# Patient Record
Sex: Female | Born: 1940 | Race: White | Hispanic: No | Smoking: Never smoker
Health system: Southern US, Community
[De-identification: ages and names within clinical notes are randomized; demographics above are authoritative.]

## PROBLEM LIST (undated history)

## (undated) DIAGNOSIS — E785 Hyperlipidemia, unspecified: Secondary | ICD-10-CM

## (undated) DIAGNOSIS — R945 Abnormal results of liver function studies: Secondary | ICD-10-CM

## (undated) DIAGNOSIS — I10 Essential (primary) hypertension: Secondary | ICD-10-CM

## (undated) DIAGNOSIS — I4891 Unspecified atrial fibrillation: Secondary | ICD-10-CM

## (undated) DIAGNOSIS — J189 Pneumonia, unspecified organism: Secondary | ICD-10-CM

## (undated) DIAGNOSIS — D580 Hereditary spherocytosis: Secondary | ICD-10-CM

## (undated) DIAGNOSIS — G9340 Encephalopathy, unspecified: Secondary | ICD-10-CM

## (undated) HISTORY — DX: Encephalopathy, unspecified: G93.40

## (undated) HISTORY — DX: Unspecified atrial fibrillation: I48.91

## (undated) HISTORY — DX: Abnormal results of liver function studies: R94.5

## (undated) HISTORY — DX: Hyperlipidemia, unspecified: E78.5

## (undated) HISTORY — PX: SPLENECTOMY: SUR1306

## (undated) HISTORY — DX: Pneumonia, unspecified organism: J18.9

---

## 1999-09-20 ENCOUNTER — Emergency Department (HOSPITAL_COMMUNITY): Admission: EM | Admit: 1999-09-20 | Discharge: 1999-09-20 | Payer: Self-pay | Admitting: Emergency Medicine

## 1999-09-20 ENCOUNTER — Encounter: Payer: Self-pay | Admitting: Emergency Medicine

## 1999-10-21 ENCOUNTER — Encounter: Payer: Self-pay | Admitting: Endocrinology

## 1999-10-21 ENCOUNTER — Encounter: Admission: RE | Admit: 1999-10-21 | Discharge: 1999-10-21 | Payer: Self-pay | Admitting: Endocrinology

## 1999-11-16 ENCOUNTER — Other Ambulatory Visit: Admission: RE | Admit: 1999-11-16 | Discharge: 1999-11-16 | Payer: Self-pay | Admitting: Endocrinology

## 2000-12-07 ENCOUNTER — Other Ambulatory Visit: Admission: RE | Admit: 2000-12-07 | Discharge: 2000-12-07 | Payer: Self-pay | Admitting: Endocrinology

## 2000-12-21 ENCOUNTER — Encounter: Payer: Self-pay | Admitting: Endocrinology

## 2000-12-21 ENCOUNTER — Encounter: Admission: RE | Admit: 2000-12-21 | Discharge: 2000-12-21 | Payer: Self-pay | Admitting: Endocrinology

## 2002-11-04 ENCOUNTER — Ambulatory Visit (HOSPITAL_COMMUNITY): Admission: RE | Admit: 2002-11-04 | Discharge: 2002-11-04 | Payer: Self-pay | Admitting: Ophthalmology

## 2002-11-06 ENCOUNTER — Ambulatory Visit (HOSPITAL_COMMUNITY): Admission: RE | Admit: 2002-11-06 | Discharge: 2002-11-06 | Payer: Self-pay | Admitting: Ophthalmology

## 2007-12-31 ENCOUNTER — Emergency Department (HOSPITAL_COMMUNITY): Admission: EM | Admit: 2007-12-31 | Discharge: 2007-12-31 | Payer: Self-pay | Admitting: Emergency Medicine

## 2011-02-25 LAB — POCT I-STAT, CHEM 8
BUN: 7
Calcium, Ion: 1.15
Chloride: 100
Creatinine, Ser: 1
Glucose, Bld: 100 — ABNORMAL HIGH
HCT: 43
Hemoglobin: 14.6
Potassium: 3.5
Sodium: 139
TCO2: 31

## 2012-09-07 ENCOUNTER — Telehealth (HOSPITAL_COMMUNITY): Payer: Self-pay | Admitting: Psychiatry

## 2012-09-20 ENCOUNTER — Emergency Department (HOSPITAL_COMMUNITY)
Admission: EM | Admit: 2012-09-20 | Discharge: 2012-09-20 | Disposition: A | Discharge status: Home / Self Care | Payer: Medicare Other | Attending: Emergency Medicine | Admitting: Emergency Medicine

## 2012-09-20 ENCOUNTER — Encounter (HOSPITAL_COMMUNITY): Payer: Self-pay | Admitting: Family Medicine

## 2012-09-20 DIAGNOSIS — J3489 Other specified disorders of nose and nasal sinuses: Secondary | ICD-10-CM | POA: Insufficient documentation

## 2012-09-20 DIAGNOSIS — R630 Anorexia: Secondary | ICD-10-CM | POA: Insufficient documentation

## 2012-09-20 DIAGNOSIS — R059 Cough, unspecified: Secondary | ICD-10-CM | POA: Insufficient documentation

## 2012-09-20 DIAGNOSIS — R05 Cough: Secondary | ICD-10-CM | POA: Insufficient documentation

## 2012-09-20 DIAGNOSIS — Z862 Personal history of diseases of the blood and blood-forming organs and certain disorders involving the immune mechanism: Secondary | ICD-10-CM | POA: Insufficient documentation

## 2012-09-20 DIAGNOSIS — I10 Essential (primary) hypertension: Secondary | ICD-10-CM | POA: Insufficient documentation

## 2012-09-20 DIAGNOSIS — R61 Generalized hyperhidrosis: Secondary | ICD-10-CM | POA: Insufficient documentation

## 2012-09-20 DIAGNOSIS — F29 Unspecified psychosis not due to a substance or known physiological condition: Secondary | ICD-10-CM | POA: Insufficient documentation

## 2012-09-20 DIAGNOSIS — R55 Syncope and collapse: Secondary | ICD-10-CM | POA: Insufficient documentation

## 2012-09-20 DIAGNOSIS — I951 Orthostatic hypotension: Secondary | ICD-10-CM

## 2012-09-20 HISTORY — DX: Essential (primary) hypertension: I10

## 2012-09-20 HISTORY — DX: Hereditary spherocytosis: D58.0

## 2012-09-20 LAB — CBC
HCT: 41.8 % (ref 36.0–46.0)
Hemoglobin: 16.1 g/dL — ABNORMAL HIGH (ref 12.0–15.0)
MCH: 34.3 pg — ABNORMAL HIGH (ref 26.0–34.0)
MCHC: 38.5 g/dL — ABNORMAL HIGH (ref 30.0–36.0)
MCV: 88.9 fL (ref 78.0–100.0)
Platelets: 469 10*3/uL — ABNORMAL HIGH (ref 150–400)
RBC: 4.7 MIL/uL (ref 3.87–5.11)
RDW: 11.9 % (ref 11.5–15.5)
WBC: 7.2 10*3/uL (ref 4.0–10.5)

## 2012-09-20 LAB — POCT I-STAT, CHEM 8
BUN: 8 mg/dL (ref 6–23)
Calcium, Ion: 1.05 mmol/L — ABNORMAL LOW (ref 1.13–1.30)
Chloride: 99 mEq/L (ref 96–112)
Creatinine, Ser: 0.8 mg/dL (ref 0.50–1.10)
Glucose, Bld: 106 mg/dL — ABNORMAL HIGH (ref 70–99)
HCT: 43 % (ref 36.0–46.0)
Hemoglobin: 14.6 g/dL (ref 12.0–15.0)
Potassium: 3.5 mEq/L (ref 3.5–5.1)
Sodium: 139 mEq/L (ref 135–145)
TCO2: 26 mmol/L (ref 0–100)

## 2012-09-20 LAB — BASIC METABOLIC PANEL
BUN: 9 mg/dL (ref 6–23)
CO2: 28 mEq/L (ref 19–32)
Calcium: 8.5 mg/dL (ref 8.4–10.5)
Chloride: 101 mEq/L (ref 96–112)
Creatinine, Ser: 0.83 mg/dL (ref 0.50–1.10)
GFR calc Af Amer: 82 mL/min — ABNORMAL LOW (ref >90)
GFR calc non Af Amer: 70 mL/min — ABNORMAL LOW (ref >90)
Glucose, Bld: 110 mg/dL — ABNORMAL HIGH (ref 70–99)
Potassium: 3.6 mEq/L (ref 3.5–5.1)
Sodium: 138 mEq/L (ref 135–145)

## 2012-09-20 LAB — TROPONIN I: Troponin I: <0.3 ng/mL (ref <0.30)

## 2012-09-20 MED ORDER — SODIUM CHLORIDE 0.9 % IV SOLN
1000.0000 mL | Freq: Once | INTRAVENOUS | Status: AC
  Administered 2012-09-20: 1000 mL via INTRAVENOUS

## 2012-09-20 NOTE — ED Notes (Addendum)
MD at bedside discussing results.Pt provided with water to drink. Family at bedside.

## 2012-09-20 NOTE — ED Provider Notes (Deleted)
History    Note Entered in Error, disregard   CSN: [id number]  Arrival date & time 09/20/12  1214   First MD Initiated Contact with Patient 09/20/12 1234      Chief Complaint  Patient presents with  . Near Syncope    (Consider location/radiation/quality/duration/timing/severity/associated sxs/prior treatment) HPI  Past Surgical History  Procedure Laterality Date  . Splenectomy      No family history on file.  History  Substance Use Topics  . Smoking status: Never Smoker   . Smokeless tobacco: Never Used  . Alcohol Use: No    OB History   Grav Para Term Preterm Abortions TAB SAB Ect Mult Living                  Review of Systems  Allergies  Aspirin  Home Medications   Current Outpatient Rx  Name  Route  Sig  Dispense  Refill  . acetaminophen (TYLENOL) 500 MG tablet   Oral   Take 500 mg by mouth every 6 (six) hours as needed for pain.         Marland Kitchen amLODipine (NORVASC) 2.5 MG tablet   Oral   Take 2.5 mg by mouth daily.         . fish oil-omega-3 fatty acids 1000 MG capsule   Oral   Take 1 g by mouth 2 (two) times daily.           BP 104/57  Pulse 90  Temp(Src) 97.7 F (36.5 C) (Oral)  Resp 16  SpO2 96%  Physical Exam  ED Course  Procedures (including critical care time)  Labs Reviewed  BASIC METABOLIC PANEL  CBC  TROPONIN I   No results found.   No diagnosis found.    MDM          Ardyth Gal, MD 09/20/12 1415

## 2012-09-20 NOTE — ED Notes (Signed)
Pt ambulated around Pod A x2. Pt noted no dizziness or weakness. Vital signs taken upon arrival to room and charted.

## 2012-09-20 NOTE — ED Provider Notes (Signed)
I saw and evaluated the patient, reviewed the resident's note and I agree with the findings and plan.  I personally evaluated the ECG and agree with the interpretation of the resident  Felt better after fluids.    Lyanne Co, MD 09/20/12 [phone]

## 2012-09-20 NOTE — ED Provider Notes (Signed)
History     CSN: [id number]  Arrival date & time 09/20/12  1214   First MD Initiated Contact with Patient 09/20/12 1234      Chief Complaint  Patient presents with  . Near Syncope    (Consider location/radiation/quality/duration/timing/severity/associated sxs/prior treatment) HPI  Patient comes in for a syncopal episode that happened this morning.  She says she had gone to the bathroom and then was washing her hands and felt very funny.  She thought she needed to get back into bed, and then passed out.  She has difficulty describing the funny feeling- had diaphoresis, but no nausea, no light headedness, no vertigo, no chest pain, no dyspnea.  She says her son came and asked if she was OK, she thinks she was unconscious for less than a minutes.  She says her bottom is sore where she thinks she landed but no where else.  She went to her PCP ([facility] office on [address]) and they told her she needed to come here.    Patient endorses having nasal congestion and cough for past 3 days or so.  She has spent more time in bed, says she has not had much of an appetite.   Past Medical History  Diagnosis Date  . HTN (hypertension)   . Hereditary spherocytosis     Past Surgical History  Procedure Laterality Date  . Splenectomy      No family history on file.  History  Substance Use Topics  . Smoking status: Never Smoker   . Smokeless tobacco: Never Used  . Alcohol Use: No   Review of Systems  Constitutional: Negative for fever.  HENT: Positive for rhinorrhea.   Eyes: Negative for visual disturbance.  Respiratory: Positive for cough. Negative for shortness of breath.   Cardiovascular: Negative for chest pain, palpitations and leg swelling.  Gastrointestinal: Negative for nausea, vomiting and abdominal pain.  Genitourinary: Negative for difficulty urinating.  Musculoskeletal: Negative for myalgias.  Skin: Negative for rash.  Neurological: Positive for syncope. Negative for dizziness  and light-headedness.  Psychiatric/Behavioral: Positive for confusion.    Allergies  Aspirin  Home Medications   Current Outpatient Rx  Name  Route  Sig  Dispense  Refill  . acetaminophen (TYLENOL) 500 MG tablet   Oral   Take 500 mg by mouth every 6 (six) hours as needed for pain.         Marland Kitchen amLODipine (NORVASC) 2.5 MG tablet   Oral   Take 2.5 mg by mouth daily.         . fish oil-omega-3 fatty acids 1000 MG capsule   Oral   Take 1 g by mouth 2 (two) times daily.           BP 104/57  Pulse 90  Temp(Src) 97.7 F (36.5 C) (Oral)  Resp 16  SpO2 96% BP (cuff) Lying: 134/59 Sitting: 114/54 Standing: 104/57 Physical Exam  Constitutional: She is oriented to person, place, and time. She appears well-developed and well-nourished. No distress.  HENT:  Head: Normocephalic and atraumatic.  Mouth/Throat: Oropharynx is clear and moist.  Eyes: EOM are normal. Pupils are equal, round, and reactive to light.  Neck: Normal range of motion.  Cardiovascular: Normal rate and regular rhythm.   No murmur heard. Pulmonary/Chest: Effort normal and breath sounds normal. She has no rales.  Abdominal: Soft. Bowel sounds are normal. There is no tenderness.  Musculoskeletal: Normal range of motion. She exhibits no edema.  Minimal tenderness to palpation in low back-  no point tenderness on spine or sacrum.   Neurological: She is alert and oriented to person, place, and time. No cranial nerve deficit.  Skin: No rash noted.    ED Course  Procedures (including critical care time)  Labs Reviewed  BASIC METABOLIC PANEL - Abnormal; Notable for the following:    Glucose, Bld 110 (*)    GFR calc non Af Amer 70 (*)    GFR calc Af Amer 82 (*)    All other components within normal limits  POCT I-STAT, CHEM 8 - Abnormal; Notable for the following:    Glucose, Bld 106 (*)    Calcium, Ion 1.05 (*)    All other components within normal limits  TROPONIN I  CBC   No results found.   Date:  09/20/2012  Rate: 81  Rhythm: normal sinus rhythm  QRS Axis: normal  Intervals: normal  ST/T Wave abnormalities: normal  Conduction Disutrbances:none  Narrative Interpretation: Normal ECG  Old EKG Reviewed: unchanged  1. Orthostasis       MDM  Patient improved with 1L NS bolus, able to ambulate without light headedness.  - will have her hold Norvasc for right now and follow up with PCP in 1 week        Ardyth Gal, MD 09/20/12 1431

## 2012-09-20 NOTE — ED Notes (Signed)
Per EMS: Pt from home with c/o syncopal episode. States she fell in her bedroom this AM, and was found by son shortly after. Denies hitting head, but reports brief LOC. AO x4. Reports pain to tailbone.Ambulatory on scene. 136/70. 70 SR. 16 RR. 86 BG. 98% RA.

## 2012-09-20 NOTE — ED Notes (Signed)
MD at bedside. 

## 2013-06-13 DIAGNOSIS — M810 Age-related osteoporosis without current pathological fracture: Secondary | ICD-10-CM | POA: Insufficient documentation

## 2013-11-20 ENCOUNTER — Encounter: Payer: Self-pay | Admitting: *Deleted

## 2013-11-20 ENCOUNTER — Encounter: Payer: Self-pay | Admitting: Interventional Cardiology

## 2014-06-06 DIAGNOSIS — Z9081 Acquired absence of spleen: Secondary | ICD-10-CM | POA: Insufficient documentation

## 2017-05-18 DIAGNOSIS — E785 Hyperlipidemia, unspecified: Secondary | ICD-10-CM | POA: Insufficient documentation

## 2017-12-15 ENCOUNTER — Inpatient Hospital Stay (HOSPITAL_COMMUNITY)
Admission: EM | Admit: 2017-12-15 | Discharge: 2017-12-19 | DRG: 193 | Disposition: A | Discharge status: Home / Self Care | Payer: Medicare HMO | Attending: Internal Medicine | Admitting: Internal Medicine

## 2017-12-15 ENCOUNTER — Encounter (HOSPITAL_COMMUNITY): Payer: Self-pay | Admitting: Emergency Medicine

## 2017-12-15 ENCOUNTER — Emergency Department (HOSPITAL_COMMUNITY): Payer: Medicare HMO

## 2017-12-15 DIAGNOSIS — J181 Lobar pneumonia, unspecified organism: Principal | ICD-10-CM | POA: Diagnosis present

## 2017-12-15 DIAGNOSIS — I4891 Unspecified atrial fibrillation: Secondary | ICD-10-CM

## 2017-12-15 DIAGNOSIS — G9341 Metabolic encephalopathy: Secondary | ICD-10-CM | POA: Diagnosis present

## 2017-12-15 DIAGNOSIS — E876 Hypokalemia: Secondary | ICD-10-CM | POA: Diagnosis present

## 2017-12-15 DIAGNOSIS — I10 Essential (primary) hypertension: Secondary | ICD-10-CM | POA: Diagnosis present

## 2017-12-15 DIAGNOSIS — R911 Solitary pulmonary nodule: Secondary | ICD-10-CM | POA: Diagnosis present

## 2017-12-15 DIAGNOSIS — R945 Abnormal results of liver function studies: Secondary | ICD-10-CM | POA: Diagnosis present

## 2017-12-15 DIAGNOSIS — Z9081 Acquired absence of spleen: Secondary | ICD-10-CM

## 2017-12-15 DIAGNOSIS — R748 Abnormal levels of other serum enzymes: Secondary | ICD-10-CM

## 2017-12-15 DIAGNOSIS — J189 Pneumonia, unspecified organism: Secondary | ICD-10-CM | POA: Diagnosis present

## 2017-12-15 DIAGNOSIS — I480 Paroxysmal atrial fibrillation: Secondary | ICD-10-CM | POA: Diagnosis present

## 2017-12-15 DIAGNOSIS — G9340 Encephalopathy, unspecified: Secondary | ICD-10-CM | POA: Diagnosis present

## 2017-12-15 DIAGNOSIS — Z79899 Other long term (current) drug therapy: Secondary | ICD-10-CM

## 2017-12-15 DIAGNOSIS — R7989 Other specified abnormal findings of blood chemistry: Secondary | ICD-10-CM | POA: Diagnosis present

## 2017-12-15 DIAGNOSIS — I4811 Longstanding persistent atrial fibrillation: Secondary | ICD-10-CM | POA: Diagnosis present

## 2017-12-15 DIAGNOSIS — Z886 Allergy status to analgesic agent status: Secondary | ICD-10-CM

## 2017-12-15 DIAGNOSIS — E785 Hyperlipidemia, unspecified: Secondary | ICD-10-CM | POA: Diagnosis present

## 2017-12-15 DIAGNOSIS — R441 Visual hallucinations: Secondary | ICD-10-CM | POA: Diagnosis present

## 2017-12-15 DIAGNOSIS — Z23 Encounter for immunization: Secondary | ICD-10-CM

## 2017-12-15 LAB — CBC WITH DIFFERENTIAL/PLATELET
Abs Immature Granulocytes: 0.1 10*3/uL (ref 0.0–0.1)
Basophils Absolute: 0.1 10*3/uL (ref 0.0–0.1)
Basophils Relative: 0 %
Eosinophils Absolute: 0.1 10*3/uL (ref 0.0–0.7)
Eosinophils Relative: 1 %
HCT: 38.8 % (ref 36.0–46.0)
Hemoglobin: 13.7 g/dL (ref 12.0–15.0)
Immature Granulocytes: 1 %
Lymphocytes Relative: 17 %
Lymphs Abs: 2.6 10*3/uL (ref 0.7–4.0)
MCH: 33.6 pg (ref 26.0–34.0)
MCHC: 35.3 g/dL (ref 30.0–36.0)
MCV: 95.1 fL (ref 78.0–100.0)
Monocytes Absolute: 1.5 10*3/uL — ABNORMAL HIGH (ref 0.1–1.0)
Monocytes Relative: 10 %
Neutro Abs: 11.5 10*3/uL — ABNORMAL HIGH (ref 1.7–7.7)
Neutrophils Relative %: 71 %
Platelets: 753 10*3/uL — ABNORMAL HIGH (ref 150–400)
RBC: 4.08 MIL/uL (ref 3.87–5.11)
RDW: 11.4 % — ABNORMAL LOW (ref 11.5–15.5)
WBC: 15.9 10*3/uL — ABNORMAL HIGH (ref 4.0–10.5)

## 2017-12-15 LAB — COMPREHENSIVE METABOLIC PANEL
ALT: 41 U/L (ref 0–44)
AST: 60 U/L — ABNORMAL HIGH (ref 15–41)
Albumin: 3.3 g/dL — ABNORMAL LOW (ref 3.5–5.0)
Alkaline Phosphatase: 513 U/L — ABNORMAL HIGH (ref 38–126)
Anion gap: 12 (ref 5–15)
BUN: 7 mg/dL — ABNORMAL LOW (ref 8–23)
CO2: 25 mmol/L (ref 22–32)
Calcium: 8.8 mg/dL — ABNORMAL LOW (ref 8.9–10.3)
Chloride: 99 mmol/L (ref 98–111)
Creatinine, Ser: 0.88 mg/dL (ref 0.44–1.00)
GFR calc Af Amer: >60 mL/min (ref >60)
GFR calc non Af Amer: >60 mL/min (ref >60)
Glucose, Bld: 107 mg/dL — ABNORMAL HIGH (ref 70–99)
Potassium: 3.5 mmol/L (ref 3.5–5.1)
Sodium: 136 mmol/L (ref 135–145)
Total Bilirubin: 1.7 mg/dL — ABNORMAL HIGH (ref 0.3–1.2)
Total Protein: 7.4 g/dL (ref 6.5–8.1)

## 2017-12-15 LAB — I-STAT CG4 LACTIC ACID, ED: Lactic Acid, Venous: 1.68 mmol/L (ref 0.5–1.9)

## 2017-12-15 LAB — URINALYSIS, ROUTINE W REFLEX MICROSCOPIC
Glucose, UA: 100 mg/dL — AB
Hgb urine dipstick: NEGATIVE
Ketones, ur: 15 mg/dL — AB
Leukocytes, UA: NEGATIVE
Nitrite: NEGATIVE
Protein, ur: NEGATIVE mg/dL
Specific Gravity, Urine: 1.025 (ref 1.005–1.030)
pH: 5 (ref 5.0–8.0)

## 2017-12-15 MED ORDER — SODIUM CHLORIDE 0.9 % IV BOLUS (SEPSIS)
1000.0000 mL | Freq: Once | INTRAVENOUS | Status: AC
  Administered 2017-12-16: 1000 mL via INTRAVENOUS

## 2017-12-15 MED ORDER — SODIUM CHLORIDE 0.9 % IV SOLN
1000.0000 mL | INTRAVENOUS | Status: DC
  Administered 2017-12-16: 1000 mL via INTRAVENOUS

## 2017-12-15 MED ORDER — SODIUM CHLORIDE 0.9 % IV SOLN
1.0000 g | Freq: Once | INTRAVENOUS | Status: AC
  Administered 2017-12-16: 1 g via INTRAVENOUS
  Filled 2017-12-15: qty 10

## 2017-12-15 MED ORDER — SODIUM CHLORIDE 0.9 % IV SOLN
500.0000 mg | Freq: Once | INTRAVENOUS | Status: AC
  Administered 2017-12-16: 500 mg via INTRAVENOUS
  Filled 2017-12-15: qty 500

## 2017-12-15 NOTE — ED Provider Notes (Addendum)
[facility] EMERGENCY DEPARTMENT Provider Note   CSN: [id number] Arrival date & time: 12/15/17  1948     History   Chief Complaint Chief Complaint  Patient presents with  . Cough    HPI Kayla Booth is a 77 y.o. female.  Patient presents from home with a 2-week history of "cold symptoms" described as cough productive of green-yellow mucus.  Denies any runny nose or sore throat.  Denies any fever.  She is had generalized weakness but no falls or dizziness.  No vomiting or diarrhea.  She denies any shortness of breath or chest pain.  She came in today at the encouragement of her family because she started having visual hallucinations yesterday.  States she is seeing puzzle pieces that are not actually there and picking at things she denies any history of this.  Her only medical history is hypertension.  She has no documented fevers at home.. She denies any illicit drug or alcohol use.    The history is provided by the patient.  Cough  Associated symptoms include chills, rhinorrhea and myalgias. Pertinent negatives include no headaches.    Past Medical History:  Diagnosis Date  . Hereditary spherocytosis ([place])   . HTN (hypertension)   . Hyperlipidemia     There are no active problems to display for this patient.   Past Surgical History:  Procedure Laterality Date  . SPLENECTOMY       OB History   None      Home Medications    Prior to Admission medications   Medication Sig Start Date End Date Taking? Authorizing Provider  acetaminophen (TYLENOL) 500 MG tablet Take 500 mg by mouth every 6 (six) hours as needed for pain.    [provider]  fish oil-omega-3 fatty acids 1000 MG capsule Take 1 g by mouth 2 (two) times daily.    [provider]    Family History Family History  Problem Relation Age of Onset  . Heart failure Unknown     Social History Social History   Tobacco Use  . Smoking status: Never Smoker  .  Smokeless tobacco: Never Used  Substance Use Topics  . Alcohol use: No  . Drug use: No     Allergies   Aspirin   Review of Systems Review of Systems  Constitutional: Positive for activity change, appetite change, chills and fatigue.  HENT: Positive for congestion and rhinorrhea.   Eyes: Negative for visual disturbance.  Respiratory: Positive for cough.   Gastrointestinal: Negative for abdominal pain, nausea and vomiting.  Genitourinary: Negative for dysuria, vaginal bleeding and vaginal discharge.  Musculoskeletal: Positive for arthralgias and myalgias.  Skin: Negative for rash.  Neurological: Positive for weakness. Negative for dizziness and headaches.  Psychiatric/Behavioral: Positive for hallucinations.   all other systems are negative except as noted in the HPI and PMH.     Physical Exam Updated Vital Signs BP 116/74 (BP Location: Right Arm)   Pulse (!) 124   Temp 99.2 F (37.3 C) (Oral)   Resp 18   Ht 5\' 2"  (1.575 m)   Wt 68 kg (150 lb)   SpO2 96%   BMI 27.44 kg/m   Physical Exam  Constitutional: She is oriented to person, place, and time. She appears well-developed and well-nourished. No distress.  Comfortable, no respiratory distress  HENT:  Head: Normocephalic and atraumatic.  Mouth/Throat: Oropharynx is clear and moist. No oropharyngeal exudate.  Mildly dry mucous membranes  Eyes: Pupils are equal,  round, and reactive to light. Conjunctivae and EOM are normal.  Neck: Normal range of motion. Neck supple.  No meningismus.  Cardiovascular: Normal rate, normal heart sounds and intact distal pulses.  No murmur heard. Irregular Tachycardic to 120s  Pulmonary/Chest: Effort normal and breath sounds normal. No respiratory distress.  Scattered rhonchi  Abdominal: Soft. There is no tenderness. There is no rebound and no guarding.  Musculoskeletal: Normal range of motion. She exhibits no edema or tenderness.  Neurological: She is alert and oriented to person,  place, and time. No cranial nerve deficit. She exhibits normal muscle tone. Coordination normal.  No ataxia on finger to nose bilaterally. No pronator drift. 5/5 strength throughout. CN 2-12 intact.Equal grip strength. Sensation intact.   Skin: Skin is warm.  Psychiatric: She has a normal mood and affect. Her behavior is normal.  Nursing note and vitals reviewed.    ED Treatments / Results  Labs (all labs ordered are listed, but only abnormal results are displayed) Labs Reviewed  COMPREHENSIVE METABOLIC PANEL - Abnormal; Notable for the following components:      Result Value   Glucose, Bld 107 (*)    BUN 7 (*)    Calcium 8.8 (*)    Albumin 3.3 (*)    AST 60 (*)    Alkaline Phosphatase 513 (*)    Total Bilirubin 1.7 (*)    All other components within normal limits  CBC WITH DIFFERENTIAL/PLATELET - Abnormal; Notable for the following components:   WBC 15.9 (*)    RDW 11.4 (*)    Platelets 753 (*)    Neutro Abs 11.5 (*)    Monocytes Absolute 1.5 (*)    All other components within normal limits  URINALYSIS, ROUTINE W REFLEX MICROSCOPIC - Abnormal; Notable for the following components:   Color, Urine AMBER (*)    APPearance CLOUDY (*)    Glucose, UA 100 (*)    Bilirubin Urine MODERATE (*)    Ketones, ur 15 (*)    All other components within normal limits  GAMMA GT - Abnormal; Notable for the following components:   GGT 188 (*)    All other components within normal limits  BILIRUBIN, DIRECT - Abnormal; Notable for the following components:   Bilirubin, Direct 0.4 (*)    All other components within normal limits  COMPREHENSIVE METABOLIC PANEL - Abnormal; Notable for the following components:   Potassium 3.3 (*)    CO2 21 (*)    Glucose, Bld 106 (*)    BUN 6 (*)    Calcium 7.5 (*)    Total Protein 5.8 (*)    Albumin 2.8 (*)    Alkaline Phosphatase 397 (*)    Total Bilirubin 1.3 (*)    All other components within normal limits  CULTURE, BLOOD (ROUTINE X 2)  CULTURE,  BLOOD (ROUTINE X 2)  CULTURE, EXPECTORATED SPUTUM-ASSESSMENT  GRAM STAIN  AMMONIA  TSH  VITAMIN B12  PROCALCITONIN  STREP PNEUMONIAE URINARY ANTIGEN  MAGNESIUM  FOLATE RBC  RPR  HIV ANTIBODY (ROUTINE TESTING)  LEGIONELLA PNEUMOPHILA SEROGP 1 UR AG  HEPARIN LEVEL (UNFRACTIONATED)  I-STAT CG4 LACTIC ACID, ED  I-STAT CG4 LACTIC ACID, ED    EKG EKG Interpretation  Date/Time:  Saturday December 16 2017 00:19:15 EDT Ventricular Rate:  118 PR Interval:    QRS Duration: 73 QT Interval:  301 QTC Calculation: 420 R Axis:   74 Text Interpretation:  Atrial fibrillation Nonspecific T abnrm, anterolateral leads new atrial fibrillation  Confirmed by Goble Fudala,  Annie Main [phone]) on 12/16/2017 12:31:01 AM   Radiology Dg Chest 2 View  Result Date: 12/15/2017 CLINICAL DATA:  Cough and congestion for 2 weeks. EXAM: CHEST - 2 VIEW COMPARISON:  None. FINDINGS: Cardiomediastinal silhouette is normal. Mild interstitial prominence. Lingular patchy airspace opacity and LEFT lung base strandy densities. Small pleural effusions. No pneumothorax. Soft tissue planes and included osseous structures are non suspicious. Osteopenia. IMPRESSION: Lingular atelectasis versus pneumonia.  Small pleural effusions. Mild interstitial prominence with LEFT lung base atelectasis/scarring. Electronically Signed   By: Elon Alas M.D.   On: 12/15/2017 20:54    Procedures Procedures (including critical care time)  Medications Ordered in ED Medications  sodium chloride 0.9 % bolus 1,000 mL (has no administration in time range)    Followed by  0.9 %  sodium chloride infusion (has no administration in time range)  cefTRIAXone (ROCEPHIN) 1 g in sodium chloride 0.9 % 100 mL IVPB (has no administration in time range)  azithromycin (ZITHROMAX) 500 mg in sodium chloride 0.9 % 250 mL IVPB (has no administration in time range)     Initial Impression / Assessment and Plan / ED Course  I have reviewed the triage vital signs and  the nursing notes.  Pertinent labs & imaging results that were available during my care of the patient were reviewed by me and considered in my medical decision making (see chart for details).    Patient with 2 weeks of cough and congestion with generalized weakness and new visual hallucinations.  She has a nonfocal neurological exam.  She is not hypoxic.  She is tachycardic and borderline febrile.  X-ray is concerning for pneumonia.  She will be hydrated and antibiotics will be started.   Patient appears to have new onset atrial fibrillation.  She denies any dizziness or lightheadedness. Concerned that her visual hallucinations are from her ongoing pneumonia.  She will be treated with IV antibiotics.  She is given a low-dose beta-blocker for rate control.  Admission discussed with Dr. Myna Hidalgo.  CRITICAL CARE Performed by: Ezequiel Essex Total critical care time: 34 minutes Critical care time was exclusive of separately billable procedures and treating other patients. Critical care was necessary to treat or prevent imminent or life-threatening deterioration. Critical care was time spent personally by me on the following activities: development of treatment plan with patient and/or surrogate as well as nursing, discussions with consultants, evaluation of patient's response to treatment, examination of patient, obtaining history from patient or surrogate, ordering and performing treatments and interventions, ordering and review of laboratory studies, ordering and review of radiographic studies, pulse oximetry and re-evaluation of patient's condition.  Final Clinical Impressions(s) / ED Diagnoses   Final diagnoses:  Community acquired pneumonia of left lower lobe of lung [facility])  New onset atrial fibrillation [facility])    ED Discharge Orders    None       Lilac Hoff, Annie Main, MD 12/16/17 3009    Ezequiel Essex, MD 12/25/17 [phone]

## 2017-12-15 NOTE — ED Provider Notes (Addendum)
MSE was initiated and I personally evaluated the patient and placed orders (if any) at  8:25 PM on December 15, 2017.  The patient appears stable so that the remainder of the MSE may be completed by another provider.  Patient placed in Quick Look pathway, seen and evaluated   Chief Complaint: Cough for 1 week  HPI:   Patient presents to ED for 1 week history of coughing up green phlegm.  No improvement with over-the-counter Mucinex.  No sick contacts.  Denies any chest pain, shortness of breath, abdominal pain, vomiting, fever.  ROS: Cough  Physical Exam:   Gen: No distress  Neuro: Awake and Alert  Skin: Warm    Focused Exam: Tachycardic.  Rhonchi noted in left lower lungs.  No cough noted on exam.  NAD.   Initiation of care has begun. The patient has been counseled on the process, plan, and necessity for staying for the completion/evaluation, and the remainder of the medical screening examination        Delia Heady, PA-C 12/15/17 2026    Tegeler, Gwenyth Allegra, MD 12/16/17 [phone]

## 2017-12-15 NOTE — ED Triage Notes (Signed)
Pt presents to ED for cough with green phlegm x 2 weeks.  States she starting picking at things and acting abnormally yesterday with her family and they all became concerned.  Patient tachy in triage.  Denies urinary symptoms.

## 2017-12-16 ENCOUNTER — Encounter (HOSPITAL_COMMUNITY): Payer: Self-pay | Admitting: Family Medicine

## 2017-12-16 ENCOUNTER — Inpatient Hospital Stay (HOSPITAL_COMMUNITY): Payer: Medicare HMO

## 2017-12-16 ENCOUNTER — Other Ambulatory Visit: Payer: Self-pay

## 2017-12-16 DIAGNOSIS — I4891 Unspecified atrial fibrillation: Secondary | ICD-10-CM

## 2017-12-16 DIAGNOSIS — R7989 Other specified abnormal findings of blood chemistry: Secondary | ICD-10-CM | POA: Diagnosis present

## 2017-12-16 DIAGNOSIS — J189 Pneumonia, unspecified organism: Secondary | ICD-10-CM

## 2017-12-16 DIAGNOSIS — I351 Nonrheumatic aortic (valve) insufficiency: Secondary | ICD-10-CM | POA: Diagnosis not present

## 2017-12-16 DIAGNOSIS — I480 Paroxysmal atrial fibrillation: Secondary | ICD-10-CM | POA: Diagnosis present

## 2017-12-16 DIAGNOSIS — R410 Disorientation, unspecified: Secondary | ICD-10-CM | POA: Diagnosis not present

## 2017-12-16 DIAGNOSIS — Z23 Encounter for immunization: Secondary | ICD-10-CM | POA: Diagnosis present

## 2017-12-16 DIAGNOSIS — R945 Abnormal results of liver function studies: Secondary | ICD-10-CM | POA: Diagnosis present

## 2017-12-16 DIAGNOSIS — Z886 Allergy status to analgesic agent status: Secondary | ICD-10-CM | POA: Diagnosis not present

## 2017-12-16 DIAGNOSIS — I4811 Longstanding persistent atrial fibrillation: Secondary | ICD-10-CM

## 2017-12-16 DIAGNOSIS — R441 Visual hallucinations: Secondary | ICD-10-CM | POA: Diagnosis present

## 2017-12-16 DIAGNOSIS — I10 Essential (primary) hypertension: Secondary | ICD-10-CM | POA: Diagnosis present

## 2017-12-16 DIAGNOSIS — G9341 Metabolic encephalopathy: Secondary | ICD-10-CM | POA: Diagnosis present

## 2017-12-16 DIAGNOSIS — E785 Hyperlipidemia, unspecified: Secondary | ICD-10-CM | POA: Diagnosis present

## 2017-12-16 DIAGNOSIS — J181 Lobar pneumonia, unspecified organism: Principal | ICD-10-CM

## 2017-12-16 DIAGNOSIS — R911 Solitary pulmonary nodule: Secondary | ICD-10-CM | POA: Diagnosis present

## 2017-12-16 DIAGNOSIS — G9340 Encephalopathy, unspecified: Secondary | ICD-10-CM

## 2017-12-16 DIAGNOSIS — Z79899 Other long term (current) drug therapy: Secondary | ICD-10-CM | POA: Diagnosis not present

## 2017-12-16 DIAGNOSIS — R748 Abnormal levels of other serum enzymes: Secondary | ICD-10-CM | POA: Diagnosis not present

## 2017-12-16 DIAGNOSIS — Z9081 Acquired absence of spleen: Secondary | ICD-10-CM | POA: Diagnosis not present

## 2017-12-16 DIAGNOSIS — E876 Hypokalemia: Secondary | ICD-10-CM | POA: Diagnosis present

## 2017-12-16 HISTORY — DX: Longstanding persistent atrial fibrillation: I48.11

## 2017-12-16 HISTORY — DX: Encephalopathy, unspecified: G93.40

## 2017-12-16 HISTORY — DX: Other specified abnormal findings of blood chemistry: R79.89

## 2017-12-16 HISTORY — DX: Abnormal results of liver function studies: R94.5

## 2017-12-16 HISTORY — DX: Pneumonia, unspecified organism: J18.9

## 2017-12-16 HISTORY — DX: Unspecified atrial fibrillation: I48.91

## 2017-12-16 LAB — COMPREHENSIVE METABOLIC PANEL
ALT: 33 U/L (ref 0–44)
AST: 39 U/L (ref 15–41)
Albumin: 2.8 g/dL — ABNORMAL LOW (ref 3.5–5.0)
Alkaline Phosphatase: 397 U/L — ABNORMAL HIGH (ref 38–126)
Anion gap: 11 (ref 5–15)
BUN: 6 mg/dL — ABNORMAL LOW (ref 8–23)
CO2: 21 mmol/L — ABNORMAL LOW (ref 22–32)
Calcium: 7.5 mg/dL — ABNORMAL LOW (ref 8.9–10.3)
Chloride: 106 mmol/L (ref 98–111)
Creatinine, Ser: 0.69 mg/dL (ref 0.44–1.00)
GFR calc Af Amer: >60 mL/min (ref >60)
GFR calc non Af Amer: >60 mL/min (ref >60)
Glucose, Bld: 106 mg/dL — ABNORMAL HIGH (ref 70–99)
Potassium: 3.3 mmol/L — ABNORMAL LOW (ref 3.5–5.1)
Sodium: 138 mmol/L (ref 135–145)
Total Bilirubin: 1.3 mg/dL — ABNORMAL HIGH (ref 0.3–1.2)
Total Protein: 5.8 g/dL — ABNORMAL LOW (ref 6.5–8.1)

## 2017-12-16 LAB — STREP PNEUMONIAE URINARY ANTIGEN: Strep Pneumo Urinary Antigen: NEGATIVE

## 2017-12-16 LAB — BILIRUBIN, DIRECT: Bilirubin, Direct: 0.4 mg/dL — ABNORMAL HIGH (ref 0.0–0.2)

## 2017-12-16 LAB — CBC
HCT: 38.2 % (ref 36.0–46.0)
Hemoglobin: 13.3 g/dL (ref 12.0–15.0)
MCH: 33.4 pg (ref 26.0–34.0)
MCHC: 34.8 g/dL (ref 30.0–36.0)
MCV: 96 fL (ref 78.0–100.0)
Platelets: 699 10*3/uL — ABNORMAL HIGH (ref 150–400)
RBC: 3.98 MIL/uL (ref 3.87–5.11)
RDW: 11.5 % (ref 11.5–15.5)
WBC: 15.7 10*3/uL — ABNORMAL HIGH (ref 4.0–10.5)

## 2017-12-16 LAB — HIV ANTIBODY (ROUTINE TESTING W REFLEX): HIV Screen 4th Generation wRfx: NONREACTIVE

## 2017-12-16 LAB — VITAMIN B12: Vitamin B-12: 415 pg/mL (ref 180–914)

## 2017-12-16 LAB — TSH: TSH: 1.234 u[IU]/mL (ref 0.350–4.500)

## 2017-12-16 LAB — I-STAT CG4 LACTIC ACID, ED: Lactic Acid, Venous: 1.43 mmol/L (ref 0.5–1.9)

## 2017-12-16 LAB — HEPARIN LEVEL (UNFRACTIONATED)
Heparin Unfractionated: 0.34 IU/mL (ref 0.30–0.70)
Heparin Unfractionated: 0.18 IU/mL — ABNORMAL LOW (ref 0.30–0.70)

## 2017-12-16 LAB — MAGNESIUM: Magnesium: 1.7 mg/dL (ref 1.7–2.4)

## 2017-12-16 LAB — PROCALCITONIN: Procalcitonin: 0.27 ng/mL

## 2017-12-16 LAB — RPR: RPR Ser Ql: NONREACTIVE

## 2017-12-16 LAB — GAMMA GT: GGT: 188 U/L — ABNORMAL HIGH (ref 7–50)

## 2017-12-16 LAB — AMMONIA: Ammonia: 27 umol/L (ref 9–35)

## 2017-12-16 MED ORDER — METOPROLOL TARTRATE 25 MG PO TABS
25.0000 mg | ORAL_TABLET | Freq: Two times a day (BID) | ORAL | Status: DC
  Administered 2017-12-16 – 2017-12-19 (×7): 25 mg via ORAL
  Filled 2017-12-16 (×7): qty 1

## 2017-12-16 MED ORDER — DILTIAZEM HCL 25 MG/5ML IV SOLN
15.0000 mg | Freq: Once | INTRAVENOUS | Status: DC | PRN
  Filled 2017-12-16: qty 5

## 2017-12-16 MED ORDER — SENNOSIDES-DOCUSATE SODIUM 8.6-50 MG PO TABS: 1.0000 | ORAL_TABLET | Freq: Every evening | ORAL | Status: DC | PRN

## 2017-12-16 MED ORDER — POTASSIUM CHLORIDE 20 MEQ/15ML (10%) PO SOLN
40.0000 meq | Freq: Once | ORAL | Status: AC
  Administered 2017-12-16: 40 meq via ORAL
  Filled 2017-12-16: qty 30

## 2017-12-16 MED ORDER — HYDROCODONE-ACETAMINOPHEN 5-325 MG PO TABS: 1.0000 | ORAL_TABLET | ORAL | Status: DC | PRN

## 2017-12-16 MED ORDER — POTASSIUM CHLORIDE CRYS ER 20 MEQ PO TBCR
20.0000 meq | EXTENDED_RELEASE_TABLET | Freq: Once | ORAL | Status: DC
  Filled 2017-12-16: qty 1

## 2017-12-16 MED ORDER — MAGNESIUM SULFATE IN D5W 1-5 GM/100ML-% IV SOLN
1.0000 g | Freq: Once | INTRAVENOUS | Status: AC
  Administered 2017-12-16: 1 g via INTRAVENOUS
  Filled 2017-12-16: qty 100

## 2017-12-16 MED ORDER — ONDANSETRON HCL 4 MG PO TABS: 4.0000 mg | ORAL_TABLET | Freq: Four times a day (QID) | ORAL | Status: DC | PRN

## 2017-12-16 MED ORDER — POTASSIUM CHLORIDE IN NACL 20-0.9 MEQ/L-% IV SOLN
INTRAVENOUS | Status: DC
  Administered 2017-12-16: 03:00:00 via INTRAVENOUS
  Filled 2017-12-16 (×2): qty 1000

## 2017-12-16 MED ORDER — HEPARIN BOLUS VIA INFUSION
3000.0000 [IU] | Freq: Once | INTRAVENOUS | Status: AC
  Administered 2017-12-16: 3000 [IU] via INTRAVENOUS
  Filled 2017-12-16: qty 3000

## 2017-12-16 MED ORDER — METOPROLOL TARTRATE 5 MG/5ML IV SOLN
2.5000 mg | Freq: Once | INTRAVENOUS | Status: AC
  Administered 2017-12-16: 2.5 mg via INTRAVENOUS
  Filled 2017-12-16: qty 5

## 2017-12-16 MED ORDER — GUAIFENESIN-DM 100-10 MG/5ML PO SYRP
5.0000 mL | ORAL_SOLUTION | ORAL | Status: DC | PRN
  Administered 2017-12-16 (×2): 5 mL via ORAL
  Filled 2017-12-16 (×2): qty 5

## 2017-12-16 MED ORDER — CALCIUM CARBONATE-VITAMIN D 500-200 MG-UNIT PO TABS
1.0000 | ORAL_TABLET | Freq: Every day | ORAL | Status: DC
  Administered 2017-12-16 – 2017-12-19 (×4): 1 via ORAL
  Filled 2017-12-16 (×4): qty 1

## 2017-12-16 MED ORDER — SODIUM CHLORIDE 0.9% FLUSH
3.0000 mL | Freq: Two times a day (BID) | INTRAVENOUS | Status: DC
  Administered 2017-12-16 – 2017-12-19 (×7): 3 mL via INTRAVENOUS

## 2017-12-16 MED ORDER — HEPARIN (PORCINE) IN NACL 100-0.45 UNIT/ML-% IJ SOLN
1150.0000 [IU]/h | INTRAMUSCULAR | Status: DC
  Administered 2017-12-16: 950 [IU]/h via INTRAVENOUS
  Administered 2017-12-16 – 2017-12-17 (×2): 1150 [IU]/h via INTRAVENOUS
  Filled 2017-12-16 (×3): qty 250

## 2017-12-16 MED ORDER — PNEUMOCOCCAL VAC POLYVALENT 25 MCG/0.5ML IJ INJ
0.5000 mL | INJECTION | INTRAMUSCULAR | Status: AC
  Administered 2017-12-17: 0.5 mL via INTRAMUSCULAR
  Filled 2017-12-16: qty 0.5

## 2017-12-16 MED ORDER — AMLODIPINE BESYLATE 5 MG PO TABS: 5.0000 mg | ORAL_TABLET | Freq: Every day | ORAL | Status: DC

## 2017-12-16 MED ORDER — ONDANSETRON HCL 4 MG/2ML IJ SOLN: 4.0000 mg | Freq: Four times a day (QID) | INTRAMUSCULAR | Status: DC | PRN

## 2017-12-16 MED ORDER — POTASSIUM CHLORIDE CRYS ER 20 MEQ PO TBCR
40.0000 meq | EXTENDED_RELEASE_TABLET | Freq: Once | ORAL | Status: DC
  Filled 2017-12-16: qty 2

## 2017-12-16 MED ORDER — ACETAMINOPHEN 325 MG PO TABS
650.0000 mg | ORAL_TABLET | Freq: Four times a day (QID) | ORAL | Status: DC | PRN
  Administered 2017-12-16: 650 mg via ORAL
  Filled 2017-12-16: qty 2

## 2017-12-16 MED ORDER — SODIUM CHLORIDE 0.9 % IV SOLN
1.0000 g | Freq: Every day | INTRAVENOUS | Status: DC
  Administered 2017-12-16: 1 g via INTRAVENOUS
  Filled 2017-12-16 (×2): qty 10

## 2017-12-16 MED ORDER — AZITHROMYCIN 500 MG IV SOLR
500.0000 mg | Freq: Every day | INTRAVENOUS | Status: DC
  Administered 2017-12-16: 500 mg via INTRAVENOUS
  Filled 2017-12-16: qty 500

## 2017-12-16 MED ORDER — ACETAMINOPHEN 650 MG RE SUPP: 650.0000 mg | Freq: Four times a day (QID) | RECTAL | Status: DC | PRN

## 2017-12-16 NOTE — Progress Notes (Signed)
Patient returned from ultrasound. A/O X 4.

## 2017-12-16 NOTE — Progress Notes (Signed)
PROGRESS NOTE    Kayla Booth  ERX:[id number] DOB: 02-27-1941 DOA: 12/15/2017 PCP: Helane Rima, MD   Brief Narrative:  Kayla Booth is a 77 y.o. female with medical history significant for hypertension and hereditary spherocytosis status-post splenectomy, now presenting to the emergency department for evaluation of productive cough, shortness of breath, hallucinations, and confusion.  Patient reports that she developed a cough approximately 2 weeks ago that has been productive of thick green sputum.  She has become progressively dyspneic since that time and developed some confusion with visual hallucinations over the past 2 days.  She denies chest pain, palpitations, abdominal pain, vomiting, or diarrhea.  She denies any use of alcohol or illicit substances and there has not been any new medications.  She denies any recent fall or trauma.  She has never experienced these symptoms previously.  She denies any history of GI bleed, hemorrhage, or easy bruising.  ED Course: Upon arrival to the ED, patient is found to  have a temp of 37.7 C, saturating low 90s on room air, slightly tachypneic, slightly tachycardic, and with stable blood pressure.  EKG features atrial fibrillation, apparently new.  Chemistry panel is notable for an alkaline phosphatase of 513 and total bilirubin 1.7.  CBC is notable for leukocytosis to 15,900 and thrombocytosis with platelets 753,000.  Lactic acid is reassuring at 1.68.  Blood cultures were collected, 1 L of normal saline given, and the patient was started on Rocephin and azithromycin.  She remains hemodynamically stable and will be admitted for ongoing evaluation and management of community-acquired pneumonia with new atrial fibrillation and acute encephalopathy.     Assessment & Plan:   Principal Problem:   CAP (community acquired pneumonia) Active Problems:   HTN (hypertension)   New onset atrial fibrillation (HCC)   Acute encephalopathy   Abnormal  LFTs  CAP;  - She is found to have a leukocytosis, low-grade temp, and CXR concerning for PNA  -strep pneumonia negative.  -Continue with ceftriaxone and azithromycin.  -Follow blood culture.  Legionella antigen pending.  Follow cbc.   New A fib;  Started on IV heparin.  Start Metoprolol. Oral.  ECHO ordered.  Cardiology consulted.   Hypokalemia;  Replace orally.   Acute encephalopathy;  Presents with hallucinations, confusion x2 days  TSH 1.2, B 12; 415, ammonia 27,  CT head unremarkable.  Improved.     Elevated alkaline phosphatase and bilirubin  - Alkaline phosphatase elevated to 513 and total bilirubin 1.7 on admission  -[place] negative. Trending down.  -needs to follow up with GI.  -follow trend.   HTN; hold Norvasc, will start metoprolol for A fib.     DVT prophylaxis: heparin.  Code Status: full code.  Family Communication: sister who was at bedside.  Disposition Plan: home when stable.  Consultants:   Cardiology    Procedures:   ECHO   Antimicrobials:  Ceftriaxone  Azithromycin.    Subjective: She is feeling well, denies chest pain.  Cough better  Appears less confuse.   Objective: Vitals:   12/16/17 0130 12/16/17 0200 12/16/17 0300 12/16/17 0725  BP: 139/80 (!) 103/92 124/86 (!) 139/98  Pulse: (!) 108 (!) 105  (!) 125  Resp: (!) 27 (!) 26  18  Temp:   97.6 F (36.4 C) 97.6 F (36.4 C)  TempSrc:   Oral Oral  SpO2: 96% 94% 94% 94%  Weight:   66.5 kg (146 lb 9.7 oz)   Height:   5\' 2"  (1.575 m)  Intake/Output Summary (Last 24 hours) at 12/16/2017 0741 Last data filed at 12/16/2017 0636 Gross per 24 hour  Intake 212.5 ml  Output 725 ml  Net -512.5 ml   Filed Weights   12/15/17 2012 12/16/17 0300  Weight: 68 kg (150 lb) 66.5 kg (146 lb 9.7 oz)    Examination:  General exam: Appears calm and comfortable  Respiratory system: Clear to auscultation. Respiratory effort normal. Cardiovascular system: S1 & S2 heard, IRR. No JVD,  murmurs, rubs, gallops or clicks. No pedal edema. Gastrointestinal system: Abdomen is nondistended, soft and nontender. No organomegaly or masses felt. Normal bowel sounds heard. Central nervous system: Alert and oriented. No focal neurological deficits. Extremities: Symmetric 5 x 5 power. Skin: No rashes, lesions or ulcers     Data Reviewed: I have personally reviewed following labs and imaging studies  CBC: Recent Labs  Lab 12/15/17 2025  WBC 15.9*  NEUTROABS 11.5*  HGB 13.7  HCT 38.8  MCV 95.1  PLT 579*   Basic Metabolic Panel: Recent Labs  Lab 12/15/17 2025 12/16/17 0212  NA 136 138  K 3.5 3.3*  CL 99 106  CO2 25 21*  GLUCOSE 107* 106*  BUN 7* 6*  CREATININE 0.88 0.69  CALCIUM 8.8* 7.5*  MG  --  1.7   GFR: Estimated Creatinine Clearance: 54.4 mL/min (by C-G formula based on SCr of 0.69 mg/dL). Liver Function Tests: Recent Labs  Lab 12/15/17 2025 12/16/17 0212  AST 60* 39  ALT 41 33  ALKPHOS 513* 397*  BILITOT 1.7* 1.3*  PROT 7.4 5.8*  ALBUMIN 3.3* 2.8*   No results for input(s): LIPASE, AMYLASE in the last 168 hours. Recent Labs  Lab 12/16/17 0211  AMMONIA 27   Coagulation Profile: No results for input(s): INR, PROTIME in the last 168 hours. Cardiac Enzymes: No results for input(s): CKTOTAL, CKMB, CKMBINDEX, TROPONINI in the last 168 hours. BNP (last 3 results) No results for input(s): PROBNP in the last 8760 hours. HbA1C: No results for input(s): HGBA1C in the last 72 hours. CBG: No results for input(s): GLUCAP in the last 168 hours. Lipid Profile: No results for input(s): CHOL, HDL, LDLCALC, TRIG, CHOLHDL, LDLDIRECT in the last 72 hours. Thyroid Function Tests: Recent Labs    12/16/17 0211  TSH 1.234   Anemia Panel: Recent Labs    12/16/17 0211  VITAMINB12 415   Sepsis Labs: Recent Labs  Lab 12/15/17 2100 12/16/17 0058 12/16/17 0211  PROCALCITON  --   --  0.27  LATICACIDVEN 1.68 1.43  --     No results found for this or  any previous visit (from the past 240 hour(s)).       Radiology Studies: Dg Chest 2 View  Result Date: 12/15/2017 CLINICAL DATA:  Cough and congestion for 2 weeks. EXAM: CHEST - 2 VIEW COMPARISON:  None. FINDINGS: Cardiomediastinal silhouette is normal. Mild interstitial prominence. Lingular patchy airspace opacity and LEFT lung base strandy densities. Small pleural effusions. No pneumothorax. Soft tissue planes and included osseous structures are non suspicious. Osteopenia. IMPRESSION: Lingular atelectasis versus pneumonia.  Small pleural effusions. Mild interstitial prominence with LEFT lung base atelectasis/scarring. Electronically Signed   By: Elon Alas M.D.   On: 12/15/2017 20:54   Ct Head Wo Contrast  Result Date: 12/16/2017 CLINICAL DATA:  78 year old female with altered mental status. EXAM: CT HEAD WITHOUT CONTRAST TECHNIQUE: Contiguous axial images were obtained from the base of the skull through the vertex without intravenous contrast. COMPARISON:  None. FINDINGS: Brain: The ventricles  and sulci appropriate size for patient's age. The gray-white matter discrimination is preserved. There is no acute intracranial hemorrhage. No mass effect or midline shift. No extra-axial fluid collection. Vascular: No hyperdense vessel or unexpected calcification. Skull: Normal. Negative for fracture or focal lesion. Sinuses/Orbits: No acute finding. Other: None IMPRESSION: Unremarkable noncontrast CT of the brain for age. Electronically Signed   By: Anner Crete M.D.   On: 12/16/2017 01:44        Scheduled Meds: . amLODipine  5 mg Oral Daily  . calcium-vitamin D  1 tablet Oral Q breakfast  . [START ON 12/17/2017] pneumococcal 23 valent vaccine  0.5 mL Intramuscular Tomorrow-1000  . potassium chloride  20 mEq Oral Once  . sodium chloride flush  3 mL Intravenous Q12H   Continuous Infusions: . 0.9 % NaCl with KCl 20 mEq / L 100 mL/hr at 12/16/17 0320  . azithromycin    . cefTRIAXone  (ROCEPHIN)  IV    . heparin 950 Units/hr (12/16/17 0317)     LOS: 0 days    Time spent: 35 minutes.     Elmarie Shiley, MD [facility] Pager [phone]  If 7PM-7AM, please contact night-coverage [email or web address] Password [facility] 12/16/2017, 7:41 AM

## 2017-12-16 NOTE — Progress Notes (Signed)
[facility] for Heparin  Indication: atrial fibrillation  Allergies  Allergen Reactions  . Aspirin Rash   Patient Measurements: Height: 5\' 2"  (157.5 cm) Weight: 146 lb 9.7 oz (66.5 kg) IBW/kg (Calculated) : 50.1  Vital Signs: Temp: 97.4 F (36.3 C) (07/20 1646) Temp Source: Oral (07/20 1646) BP: 111/58 (07/20 1646) Pulse Rate: 94 (07/20 1646)  Labs: Recent Labs    12/15/17 2025 12/16/17 0212 12/16/17 0959 12/16/17 1556 12/16/17 1603  HGB 13.7  --   --  13.3  --   HCT 38.8  --   --  38.2  --   PLT 753*  --   --  699*  --   HEPARINUNFRC  --   --  0.34  --  0.18*  CREATININE 0.88 0.69  --   --   --     Estimated Creatinine Clearance: 54.4 mL/min (by C-G formula based on SCr of 0.69 mg/dL).  Assessment: 77 y/o F who came to the ED with cold-like symptoms, EKG reveals new onset atrial fibrillation (no prior hx per patient). Starting heparin. CBC good. Renal function good.   Heparin level low 0.18  Goal of Therapy:  Heparin level 0.3-0.7 units/ml Monitor platelets by anticoagulation protocol: Yes   Plan:  Increase heparin to 1150 units/hr Next lvl 0000  Levester Fresh, PharmD, BCPS, BCCCP Clinical Pharmacist [phone]  Please check AMION for all [place] numbers  12/16/2017 4:51 PM

## 2017-12-16 NOTE — Progress Notes (Signed)
[facility] for Heparin  Indication: atrial fibrillation  Allergies  Allergen Reactions  . Aspirin Rash   Patient Measurements: Height: 5\' 2"  (157.5 cm) Weight: 146 lb 9.7 oz (66.5 kg) IBW/kg (Calculated) : 50.1  Vital Signs: Temp: 97.6 F (36.4 C) (07/20 0725) Temp Source: Oral (07/20 0725) BP: 139/98 (07/20 0725) Pulse Rate: 125 (07/20 0725)  Labs: Recent Labs    12/15/17 2025 12/16/17 0212 12/16/17 0959  HGB 13.7  --   --   HCT 38.8  --   --   PLT 753*  --   --   HEPARINUNFRC  --   --  0.34  CREATININE 0.88 0.69  --     Estimated Creatinine Clearance: 54.4 mL/min (by C-G formula based on SCr of 0.69 mg/dL).  Assessment: 77 y/o F who came to the ED with cold-like symptoms, EKG reveals new onset atrial fibrillation (no prior hx per patient). Starting heparin. CBC good. Renal function good.   Heparin level is therapeutic at 0.34 on 950 units/hr. No bleeding noted, no CBC for today.  Goal of Therapy:  Heparin level 0.3-0.7 units/ml Monitor platelets by anticoagulation protocol: Yes   Plan:  Continue heparin drip at 950 units/hr 1600 confirmatory heparin level and CBC Daily CBC, heparin level Monitor for bleeding   Renold Genta, PharmD, BCPS Clinical Pharmacist Clinical phone for 12/16/2017 until 3p is [phone] Please check AMION for all Pharmacist numbers by unit 12/16/2017 1:05 PM

## 2017-12-16 NOTE — Plan of Care (Signed)
Discussed with patient plan of care for the evening, pain management and importance of using the call bell for help with no teach back displayed at this time.

## 2017-12-16 NOTE — Consult Note (Addendum)
Cardiology Consultation:   Patient ID: Kayla Booth; [id number]; 04-01-1941   Admit date: 12/15/2017 Date of Consult: 12/16/2017  Primary Care Provider: Helane Rima, MD Primary Cardiologist: new -Dr. Claiborne Billings Primary Electrophysiologist:  n/a   Patient Profile:   Kayla Booth is a 77 y.o. female with a hx of HTN, HLD, and hereditary spherocytosis who is being seen today for the evaluation of new atrial fibrillation in the setting of community-acquired pneumonia at the request of Dr. Tyrell Antonio.  History of Present Illness:   Kayla Booth is a pleasant 77 year old Caucasian female with past medical history of hypertension, hyperlipidemia and hereditary spherocytosis.  She does not have any prior cardiac history.  She was in her usual state of health until the past 2 days where she began to have yellow and green productive cough.  She also has some visual hallucinations where she saw some possible pieces and bugs.  She lives with her son.  Normally, she can do home cooking for 3 days and normal household activity without any issue.  She denies any recent chest discomfort.  Initial EKG on arrival to the hospital also shows new atrial fibrillation.  She does not have any awareness of palpitation.  Chest x-ray showed lingular atelectasis versus pneumonia along with a small pleural effusion.  Mild interstitial prominence but the left lung base.  Urinalysis was negative for leukocytes or nitrite.  Blood cultures currently pending.  Her home amlodipine was held and she was started on 25 mg twice daily of metoprolol.  Her heart rate is fairly controlled at this point ranging in the high 90s.  She was also started on IV heparin as well.  Cardiology has been consulted for new atrial fibrillation in the setting of pneumonia.  Note, lab work abnormality also involved platelet of 753,000.  White blood cell count 15.9.  Low albumin at 3.3.  Elevated alkaline phosphatase at 513.  CT of the head was negative for  abnormality.  Ultrasound of abdomen also did not show any clear cause for the elevated alkaline phosphatase.    Past Medical History:  Diagnosis Date  . Hereditary spherocytosis ([place])   . HTN (hypertension)   . Hyperlipidemia     Past Surgical History:  Procedure Laterality Date  . SPLENECTOMY       Home Medications:  Prior to Admission medications   Medication Sig Start Date End Date Taking? Authorizing Provider  acetaminophen (TYLENOL) 500 MG tablet Take 500 mg by mouth every 6 (six) hours as needed for pain.   Yes [provider]  amLODipine (NORVASC) 5 MG tablet Take 5 mg by mouth daily.   Yes [provider]  calcium citrate-vitamin D 500-400 MG-UNIT chewable tablet Chew 1 tablet by mouth daily.   Yes [provider]  cholecalciferol (VITAMIN D) 400 units TABS tablet Take 400 Units by mouth daily.   Yes [provider]  Cyanocobalamin (VITAMIN B-12 PO) Take 1 tablet by mouth daily.   Yes [provider]    Inpatient Medications: Scheduled Meds: . calcium-vitamin D  1 tablet Oral Q breakfast  . metoprolol tartrate  25 mg Oral BID  . [START ON 12/17/2017] pneumococcal 23 valent vaccine  0.5 mL Intramuscular Tomorrow-1000  . potassium chloride  20 mEq Oral Once  . potassium chloride  40 mEq Oral Once  . sodium chloride flush  3 mL Intravenous Q12H   Continuous Infusions: . azithromycin    . cefTRIAXone (ROCEPHIN)  IV    . heparin  950 Units/hr (12/16/17 0900)   PRN Meds: acetaminophen **OR** acetaminophen, guaiFENesin-dextromethorphan, HYDROcodone-acetaminophen, ondansetron **OR** ondansetron (ZOFRAN) IV, senna-docusate  Allergies:    Allergies  Allergen Reactions  . Aspirin Rash    Social History:   Social History   Socioeconomic History  . Marital status: Divorced    Spouse name: Not on file  . Number of children: Not on file  . Years of education: Not on file  . Highest education level: Not on file  Occupational  History  . Not on file  Social Needs  . Financial resource strain: Not on file  . Food insecurity:    Worry: Not on file    Inability: Not on file  . Transportation needs:    Medical: Not on file    Non-medical: Not on file  Tobacco Use  . Smoking status: Never Smoker  . Smokeless tobacco: Never Used  Substance and Sexual Activity  . Alcohol use: No  . Drug use: No  . Sexual activity: Not on file  Lifestyle  . Physical activity:    Days per week: Not on file    Minutes per session: Not on file  . Stress: Not on file  Relationships  . Social connections:    Talks on phone: Not on file    Gets together: Not on file    Attends religious service: Not on file    Active member of club or organization: Not on file    Attends meetings of clubs or organizations: Not on file    Relationship status: Not on file  . Intimate partner violence:    Fear of current or ex partner: Not on file    Emotionally abused: Not on file    Physically abused: Not on file    Forced sexual activity: Not on file  Other Topics Concern  . Not on file  Social History Narrative  . Not on file    Family History:    Family History  Problem Relation Age of Onset  . Heart failure Unknown      ROS:  Please see the history of present illness.   All other ROS reviewed and negative.     Physical Exam/Data:   Vitals:   12/16/17 0130 12/16/17 0200 12/16/17 0300 12/16/17 0725  BP: 139/80 (!) 103/92 124/86 (!) 139/98  Pulse: (!) 108 (!) 105  (!) 125  Resp: (!) 27 (!) 26  18  Temp:   97.6 F (36.4 C) 97.6 F (36.4 C)  TempSrc:   Oral Oral  SpO2: 96% 94% 94% 94%  Weight:   146 lb 9.7 oz (66.5 kg)   Height:   5\' 2"  (1.575 m)     Intake/Output Summary (Last 24 hours) at 12/16/2017 1422 Last data filed at 12/16/2017 1300 Gross per 24 hour  Intake 1547.43 ml  Output 725 ml  Net 822.43 ml   Filed Weights   12/15/17 2012 12/16/17 0300  Weight: 150 lb (68 kg) 146 lb 9.7 oz (66.5 kg)   Body mass  index is 26.81 kg/m.  General:  Well nourished, well developed, in no acute distress HEENT: normal Lymph: no adenopathy Neck: no JVD Endocrine:  No thryomegaly Vascular: No carotid bruits; FA pulses 2+ bilaterally without bruits  Cardiac:  Irregularly irregular; RRR; no murmur  Lungs:  Mild rhonchi. No crackles Abd: soft, nontender, no hepatomegaly  Ext: no edema Musculoskeletal:  No deformities, BUE and BLE strength normal and equal Skin: warm and dry  Neuro:  CNs  2-12 intact, no focal abnormalities noted Psych:  Normal affect   EKG:  The EKG was personally reviewed and demonstrates:  Atrial fibrillation with RVR Telemetry:  Telemetry was personally reviewed and demonstrates:  Atrial fibrillation  Relevant CV Studies: N/A  Laboratory Data:  Chemistry Recent Labs  Lab 12/15/17 2025 12/16/17 0212  NA 136 138  K 3.5 3.3*  CL 99 106  CO2 25 21*  GLUCOSE 107* 106*  BUN 7* 6*  CREATININE 0.88 0.69  CALCIUM 8.8* 7.5*  GFRNONAA >60 >60  GFRAA >60 >60  ANIONGAP 12 11    Recent Labs  Lab 12/15/17 2025 12/16/17 0212  PROT 7.4 5.8*  ALBUMIN 3.3* 2.8*  AST 60* 39  ALT 41 33  ALKPHOS 513* 397*  BILITOT 1.7* 1.3*   Hematology Recent Labs  Lab 12/15/17 2025  WBC 15.9*  RBC 4.08  HGB 13.7  HCT 38.8  MCV 95.1  MCH 33.6  MCHC 35.3  RDW 11.4*  PLT 753*   Cardiac EnzymesNo results for input(s): TROPONINI in the last 168 hours. No results for input(s): TROPIPOC in the last 168 hours.  BNPNo results for input(s): BNP, PROBNP in the last 168 hours.  DDimer No results for input(s): DDIMER in the last 168 hours.  Radiology/Studies:  Dg Chest 2 View  Result Date: 12/15/2017 CLINICAL DATA:  Cough and congestion for 2 weeks. EXAM: CHEST - 2 VIEW COMPARISON:  None. FINDINGS: Cardiomediastinal silhouette is normal. Mild interstitial prominence. Lingular patchy airspace opacity and LEFT lung base strandy densities. Small pleural effusions. No pneumothorax. Soft tissue  planes and included osseous structures are non suspicious. Osteopenia. IMPRESSION: Lingular atelectasis versus pneumonia.  Small pleural effusions. Mild interstitial prominence with LEFT lung base atelectasis/scarring. Electronically Signed   By: Elon Alas M.D.   On: 12/15/2017 20:54   Ct Head Wo Contrast  Result Date: 12/16/2017 CLINICAL DATA:  77 year old female with altered mental status. EXAM: CT HEAD WITHOUT CONTRAST TECHNIQUE: Contiguous axial images were obtained from the base of the skull through the vertex without intravenous contrast. COMPARISON:  None. FINDINGS: Brain: The ventricles and sulci appropriate size for patient's age. The gray-white matter discrimination is preserved. There is no acute intracranial hemorrhage. No mass effect or midline shift. No extra-axial fluid collection. Vascular: No hyperdense vessel or unexpected calcification. Skull: Normal. Negative for fracture or focal lesion. Sinuses/Orbits: No acute finding. Other: None IMPRESSION: Unremarkable noncontrast CT of the brain for age. Electronically Signed   By: Anner Crete M.D.   On: 12/16/2017 01:44   US Abdomen Limited Ruq  Result Date: 12/16/2017 CLINICAL DATA:  Blood ALP increased. Previous splenectomy. Hereditary spherocytosis. EXAM: ULTRASOUND ABDOMEN LIMITED RIGHT UPPER QUADRANT COMPARISON:  None. FINDINGS: Gallbladder: No gallstones or wall thickening visualized. No sonographic Murphy sign noted by sonographer. Common bile duct: Diameter: 4.4 mm. Liver: No focal lesion identified. Within normal limits in parenchymal echogenicity. Portal vein is patent on color Doppler imaging with normal direction of blood flow towards the liver. Tiny right pleural effusion. IMPRESSION: No acute findings. Electronically Signed   By: Marin Olp M.D.   On: 12/16/2017 10:08    Assessment and Plan:   1. Newly diagnosed atrial fibrillation: Heart rate is barely controlled on metoprolol 25 mg twice daily.  She seems to be  tolerating metoprolol despite admission diagnosis of pneumonia.  I will hold off on switching to diltiazem at this time.  Pending echocardiogram. Continue IV heparin  -If she does not convert by herself with rate control  therapy after treating the pneumonia, we can potentially consider placing the patient on a short course of Eliquis and arrange for outpatient DC cardioversion in 3 weeks.  After the cardioversion can consider outpatient 30-day event monitor.  If her echocardiogram is normal and outpatient event monitor does not show any recurrence of atrial fibrillation, consider discontinuation of systemic anticoagulation therapy.  2. Pneumonia: Managed by hospitalist service.  3. HTN: Blood pressure mildly elevated  4. HLD: Check fasting lipid tomorrow morning  5. Thrombocytosis: Platelets 753  6. Elevated alkaline phosphatase: Limited echo of abdomen was negative for acute process.  7. Visual hallucination: This has resolved.  Head CT was negative.   For questions or updates, please contact [place] Please consult [email or web address] for contact info under Cardiology/STEMI.   Hilbert Corrigan, [place]  12/16/2017 2:22 PM    Patient seen and examined. Agree with assessment and plan.  Kayla Booth is a pleasant 77 year old female who has a history of hypertension and hyperlipidemia.  She is status post splenectomy for hereditary spherocytosis.  She was admitted to the hospital with a 2-day history of early in sputum with productive cough had experienced transient visual hallucinations.  She presented to the hospital this morning and is felt to have pneumonia with lingular atelectasis and small pleural effusion laboratory is notable for leukocytosis and thrombocytosis.  He was found to have an elevated alkaline phosphatase and ultrasound did not show any clear etiology for this.  Upon presentation she was found to be in atrial fibrillation with rapid ventricular response 118 bpm with  nonspecific ST changes.  Her amlodipine was discontinued and she was started on metoprolol 25 mg twice a day.  Her ventricular rate is now in the upper 90s to low 100s.  She denies any chest pressure.  Allergy consultation was requested for further evaluation.  She is now on intravenous heparin therapy.  Her atrial fibrillation is of only several hours duration.  There is no prior history of chest pain or awareness of arrhythmia.  Zickel exam is notable for blood pressure about 130/90.  Her pulse is irregularly irregular at 98.  Sclera are anicteric.  Mallampati scale is a 3.  Patient admits to snoring but has never been evaluated for sleep apnea.  There are no carotid bruits.  She has decreased breath sounds no wheezing.  Rhythm was irregularly irregular with no S3 gallop.  There is a 1/6 systolic murmur.  Abdomen was soft nontender.  Distal pulses were 2+.  She had negative Homans sign.  Neurologic exam was grossly nonfocal.  Agree with intravenous heparinization.  I suspect the patient's new onset atrial fibrillation is contributed by her pneumonic process.  Agree with discontinuance of amlodipine and initiation of Toprol all, may consider oral Cardizem additional rate control if necessary.  K replete with potassium at 3.3, check magnesium level.  Recommend a 2D echo Doppler study be obtained.  Once her pneumonia clears if she is still in atrial fibrillation, consider possible TEE guided cardioversion in 48 hours versus oral anticoagulation for at least 3 to 4 weeks prior to tempted cardioversion if AF remains.  Troy Sine, MD, [facility] 12/16/2017 3:01 PM

## 2017-12-16 NOTE — Progress Notes (Signed)
ANTICOAGULATION CONSULT NOTE - Initial Consult  Pharmacy Consult for Heparin  Indication: atrial fibrillation  Allergies  Allergen Reactions  . Aspirin Rash   Patient Measurements: Height: 5\' 2"  (157.5 cm) Weight: 150 lb (68 kg) IBW/kg (Calculated) : 50.1  Vital Signs: Temp: 99.8 F (37.7 C) (07/20 0103) Temp Source: Rectal (07/20 0103) BP: 118/64 (07/20 0030) Pulse Rate: 114 (07/20 0030)  Labs: Recent Labs    12/15/17 2025  HGB 13.7  HCT 38.8  PLT 753*  CREATININE 0.88    Estimated Creatinine Clearance: 50 mL/min (by C-G formula based on SCr of 0.88 mg/dL).  Medical History: Past Medical History:  Diagnosis Date  . 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 HTN (hypertension)   . Hyperlipidemia    Assessment: 77 y/o F who came to the ED with cold-like symptoms, EKG reveals new onset atrial fibrillation (no prior hx per patient). Starting heparin. CBC good. Renal function good. PTA meds reviewed.   Goal of Therapy:  Heparin level 0.3-0.7 units/ml Monitor platelets by anticoagulation protocol: Yes   Plan:  Heparin 3000 units BOLUS Start heparin drip at 950 units/hr 1000 HL Daily CBC/HL Monitor for bleeding   Narda Bonds 12/16/2017,1:37 AM

## 2017-12-16 NOTE — H&P (Addendum)
History and Physical    AIMEE HELDMAN DDU:[id number] DOB: [birth year] DOA: 12/15/2017  PCP: Helane Rima, MD   Patient coming from: Home   Chief Complaint: Productive cough, SOB, hallucinations, confusion   HPI: Kayla Booth is a 77 y.o. female with medical history significant for hypertension and hereditary spherocytosis status-post splenectomy, now presenting to the emergency department for evaluation of productive cough, shortness of breath, hallucinations, and confusion.  Patient reports that she developed a cough approximately 2 weeks ago that has been productive of thick green sputum.  She has become progressively dyspneic since that time and developed some confusion with visual hallucinations over the past 2 days.  She denies chest pain, palpitations, abdominal pain, vomiting, or diarrhea.  She denies any use of alcohol or illicit substances and there has not been any new medications.  She denies any recent fall or trauma.  She has never experienced these symptoms previously.  She denies any history of GI bleed, hemorrhage, or easy bruising.  ED Course: Upon arrival to the ED, patient is found to  have a temp of 37.7 C, saturating low 90s on room air, slightly tachypneic, slightly tachycardic, and with stable blood pressure.  EKG features atrial fibrillation, apparently new.  Chemistry panel is notable for an alkaline phosphatase of 513 and total bilirubin 1.7.  CBC is notable for leukocytosis to 15,900 and thrombocytosis with platelets 753,000.  Lactic acid is reassuring at 1.68.  Blood cultures were collected, 1 L of normal saline given, and the patient was started on Rocephin and azithromycin.  She remains hemodynamically stable and will be admitted for ongoing evaluation and management of community-acquired pneumonia with new atrial fibrillation and acute encephalopathy.  Review of Systems:  All other systems reviewed and apart from HPI, are negative.  Past Medical History:    Diagnosis Date  . 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 HTN (hypertension)   . Hyperlipidemia     Past Surgical History:  Procedure Laterality Date  . SPLENECTOMY       reports that she has never smoked. She has never used smokeless tobacco. She reports that she does not drink alcohol or use drugs.  Allergies  Allergen Reactions  . Aspirin Rash    Family History  Problem Relation Age of Onset  . Heart failure Unknown      Prior to Admission medications   Medication Sig Start Date End Date Taking? Authorizing Provider  acetaminophen (TYLENOL) 500 MG tablet Take 500 mg by mouth every 6 (six) hours as needed for pain.   Yes [provider]  amLODipine (NORVASC) 5 MG tablet Take 5 mg by mouth daily.   Yes [provider]  calcium citrate-vitamin D 500-400 MG-UNIT chewable tablet Chew 1 tablet by mouth daily.   Yes [provider]  cholecalciferol (VITAMIN D) 400 units TABS tablet Take 400 Units by mouth daily.   Yes [provider]  Cyanocobalamin (VITAMIN B-12 PO) Take 1 tablet by mouth daily.   Yes [provider]    Physical Exam: Vitals:   12/15/17 2012 12/15/17 2345 12/16/17 0030 12/16/17 0103  BP: 116/74 122/71 118/64   Pulse: (!) 124 (!) 123 (!) 114   Resp: 18  (!) 23   Temp: 99.2 F (37.3 C)   99.8 F (37.7 C)  TempSrc: Oral   Rectal  SpO2: 96% 98% 93%   Weight: 68 kg (150 lb)     Height: 5\' 2"  (1.575 m)  Constitutional: NAD, calm  Eyes: PERTLA, lids and conjunctivae normal ENMT: Mucous membranes are moist. Posterior pharynx clear of any exudate or lesions.   Neck: normal, supple, no masses, no thyromegaly Respiratory: Slight tachypnea and dyspnea with speech. No pallor. No accessory muscle use.  Cardiovascular: Rate ~110 and irregular. No extremity edema. No significant JVD. Abdomen: No distension, no tenderness, soft. Bowel sounds normal.  Musculoskeletal: no clubbing / cyanosis. No joint deformity upper  and lower extremities.   Skin: no significant rashes, lesions, ulcers. Warm, dry, well-perfused. Neurologic: CN 2-12 grossly intact. Sensation intact. Strength 5/5 in all 4 limbs.  Psychiatric: Alert and oriented to person, place, and situation. Reports visual hallucinations. Pleasant and cooperative.     Labs on Admission: I have personally reviewed following labs and imaging studies  CBC: Recent Labs  Lab 12/15/17 2025  WBC 15.9*  NEUTROABS 11.5*  HGB 13.7  HCT 38.8  MCV 95.1  PLT 283*   Basic Metabolic Panel: Recent Labs  Lab 12/15/17 2025  NA 136  K 3.5  CL 99  CO2 25  GLUCOSE 107*  BUN 7*  CREATININE 0.88  CALCIUM 8.8*   GFR: Estimated Creatinine Clearance: 50 mL/min (by C-G formula based on SCr of 0.88 mg/dL). Liver Function Tests: Recent Labs  Lab 12/15/17 2025  AST 60*  ALT 41  ALKPHOS 513*  BILITOT 1.7*  PROT 7.4  ALBUMIN 3.3*   No results for input(s): LIPASE, AMYLASE in the last 168 hours. No results for input(s): AMMONIA in the last 168 hours. Coagulation Profile: No results for input(s): INR, PROTIME in the last 168 hours. Cardiac Enzymes: No results for input(s): CKTOTAL, CKMB, CKMBINDEX, TROPONINI in the last 168 hours. BNP (last 3 results) No results for input(s): PROBNP in the last 8760 hours. HbA1C: No results for input(s): HGBA1C in the last 72 hours. CBG: No results for input(s): GLUCAP in the last 168 hours. Lipid Profile: No results for input(s): CHOL, HDL, LDLCALC, TRIG, CHOLHDL, LDLDIRECT in the last 72 hours. Thyroid Function Tests: No results for input(s): TSH, T4TOTAL, FREET4, T3FREE, THYROIDAB in the last 72 hours. Anemia Panel: No results for input(s): VITAMINB12, FOLATE, FERRITIN, TIBC, IRON, RETICCTPCT in the last 72 hours. Urine analysis:    Component Value Date/Time   COLORURINE AMBER (A) 12/15/2017 2023   APPEARANCEUR CLOUDY (A) 12/15/2017 2023   LABSPEC 1.025 12/15/2017 2023   PHURINE 5.0 12/15/2017 2023    GLUCOSEU 100 (A) 12/15/2017 2023   HGBUR NEGATIVE 12/15/2017 2023   BILIRUBINUR MODERATE (A) 12/15/2017 2023   KETONESUR 15 (A) 12/15/2017 2023   PROTEINUR NEGATIVE 12/15/2017 2023   NITRITE NEGATIVE 12/15/2017 2023   LEUKOCYTESUR NEGATIVE 12/15/2017 2023   Sepsis Labs: @LABRCNTIP (procalcitonin:4,lacticidven:4) )No results found for this or any previous visit (from the past 240 hour(s)).   Radiological Exams on Admission: Dg Chest 2 View  Result Date: 12/15/2017 CLINICAL DATA:  Cough and congestion for 2 weeks. EXAM: CHEST - 2 VIEW COMPARISON:  None. FINDINGS: Cardiomediastinal silhouette is normal. Mild interstitial prominence. Lingular patchy airspace opacity and LEFT lung base strandy densities. Small pleural effusions. No pneumothorax. Soft tissue planes and included osseous structures are non suspicious. Osteopenia. IMPRESSION: Lingular atelectasis versus pneumonia.  Small pleural effusions. Mild interstitial prominence with LEFT lung base atelectasis/scarring. Electronically Signed   By: Elon Alas M.D.   On: 12/15/2017 20:54    EKG: Independently reviewed. Atrial fibrillation, rate 118.    Assessment/Plan   1. CAP  - Presents with 2 wks of productive  cough, progressive SOB, and hallucinations, confusion  - She is found to have a leukocytosis, low-grade temp, and CXR concerning for PNA  - Blood cultures collected in ED and she was started on empiric Rocephin and azithromycin  - Check sputum culture, strep pneumo antigen, procalcitonin   - Continue current abx while following cultures and clinical course    2. New atrial fibrillation  - Found to be in atrial fibrillation in ED with rate in low 100's  - She denies hx of this and prior EKG's on file with sinus rhythm only  - CHADS-VASc at least 38 (age x2, gender, HTN); she denies hx of bleeding or easy bruising  - Treated with Lopressor 2.5 mg IV in ED  - Continue cardiac monitoring, start IV heparin infusion, check TSH  and mag level    3. Acute encephalopathy  - Presents with hallucinations, confusion x2 days  - No definite focal findings on exam   - Possibly secondary to the acute infection  - Check head CT, TSH, ammonia, RPR, B12, and folate levels    4. Elevated alkaline phosphatase and bilirubin  - Alkaline phosphatase elevated to 513 and total bilirubin 1.7 on admission  - No abdominal tenderness  - Check GGT and fractionated bilirubin   5. Hypertension  - BP at goal  - Continue Norvasc     DVT prophylaxis: IV heparin infusion  Code Status: Full  Family Communication: Discussed with patient  Consults called: None Admission status: Inpatient     Vianne Bulls, MD [facility] Pager [phone]  If 7PM-7AM, please contact night-coverage [email or web address] Password [email or web address]  12/16/2017, 1:45 AM

## 2017-12-17 ENCOUNTER — Inpatient Hospital Stay (HOSPITAL_COMMUNITY): Payer: Medicare HMO

## 2017-12-17 DIAGNOSIS — I351 Nonrheumatic aortic (valve) insufficiency: Secondary | ICD-10-CM

## 2017-12-17 LAB — BASIC METABOLIC PANEL
Anion gap: 10 (ref 5–15)
BUN: 6 mg/dL — ABNORMAL LOW (ref 8–23)
CO2: 21 mmol/L — ABNORMAL LOW (ref 22–32)
Calcium: 8.5 mg/dL — ABNORMAL LOW (ref 8.9–10.3)
Chloride: 107 mmol/L (ref 98–111)
Creatinine, Ser: 0.63 mg/dL (ref 0.44–1.00)
GFR calc Af Amer: >60 mL/min (ref >60)
GFR calc non Af Amer: >60 mL/min (ref >60)
Glucose, Bld: 124 mg/dL — ABNORMAL HIGH (ref 70–99)
Potassium: 4.2 mmol/L (ref 3.5–5.1)
Sodium: 138 mmol/L (ref 135–145)

## 2017-12-17 LAB — CBC
HCT: 37.4 % (ref 36.0–46.0)
Hemoglobin: 12.8 g/dL (ref 12.0–15.0)
MCH: 33.3 pg (ref 26.0–34.0)
MCHC: 34.2 g/dL (ref 30.0–36.0)
MCV: 97.4 fL (ref 78.0–100.0)
Platelets: 681 10*3/uL — ABNORMAL HIGH (ref 150–400)
RBC: 3.84 MIL/uL — ABNORMAL LOW (ref 3.87–5.11)
RDW: 11.7 % (ref 11.5–15.5)
WBC: 15.8 10*3/uL — ABNORMAL HIGH (ref 4.0–10.5)

## 2017-12-17 LAB — HEPATIC FUNCTION PANEL
ALT: 63 U/L — ABNORMAL HIGH (ref 0–44)
AST: 153 U/L — ABNORMAL HIGH (ref 15–41)
Albumin: 3 g/dL — ABNORMAL LOW (ref 3.5–5.0)
Alkaline Phosphatase: 528 U/L — ABNORMAL HIGH (ref 38–126)
Bilirubin, Direct: 0.5 mg/dL — ABNORMAL HIGH (ref 0.0–0.2)
Indirect Bilirubin: 0.4 mg/dL (ref 0.3–0.9)
Total Bilirubin: 0.9 mg/dL (ref 0.3–1.2)
Total Protein: 6.8 g/dL (ref 6.5–8.1)

## 2017-12-17 LAB — IRON AND TIBC
Iron: 41 ug/dL (ref 28–170)
Saturation Ratios: 16 % (ref 10.4–31.8)
TIBC: 253 ug/dL (ref 250–450)
UIBC: 212 ug/dL

## 2017-12-17 LAB — PROCALCITONIN: Procalcitonin: 0.19 ng/mL

## 2017-12-17 LAB — HEPARIN LEVEL (UNFRACTIONATED)
Heparin Unfractionated: 0.35 IU/mL (ref 0.30–0.70)
Heparin Unfractionated: 0.4 IU/mL (ref 0.30–0.70)

## 2017-12-17 LAB — ECHOCARDIOGRAM COMPLETE
Height: 62 in
Weight: 2384.5 oz

## 2017-12-17 LAB — FERRITIN: Ferritin: 534 ng/mL — ABNORMAL HIGH (ref 11–307)

## 2017-12-17 MED ORDER — ENSURE ENLIVE PO LIQD
237.0000 mL | Freq: Two times a day (BID) | ORAL | Status: DC
  Administered 2017-12-17 – 2017-12-18 (×2): 237 mL via ORAL

## 2017-12-17 MED ORDER — PIPERACILLIN-TAZOBACTAM 3.375 G IVPB
3.3750 g | Freq: Three times a day (TID) | INTRAVENOUS | Status: DC
  Administered 2017-12-17 – 2017-12-18 (×3): 3.375 g via INTRAVENOUS
  Filled 2017-12-17 (×4): qty 50

## 2017-12-17 MED ORDER — AZITHROMYCIN 250 MG PO TABS
500.0000 mg | ORAL_TABLET | Freq: Every day | ORAL | Status: DC
  Administered 2017-12-17: 500 mg via ORAL
  Filled 2017-12-17: qty 2

## 2017-12-17 NOTE — Progress Notes (Signed)
[place] for Heparin  Indication: atrial fibrillation  Allergies  Allergen Reactions  . Aspirin Rash   Patient Measurements: Height: 5\' 2"  (157.5 cm) Weight: 149 lb 0.5 oz (67.6 kg) IBW/kg (Calculated) : 50.1  Vital Signs: Temp: 97.8 F (36.6 C) (07/21 0744) Temp Source: Oral (07/21 0744) BP: 132/68 (07/21 0744) Pulse Rate: 78 (07/21 0744)  Labs: Recent Labs    12/15/17 2025 12/16/17 0212  12/16/17 1556 12/16/17 1603 12/17/17 0028 12/17/17 0818  HGB 13.7  --   --  13.3  --  12.8  --   HCT 38.8  --   --  38.2  --  37.4  --   PLT 753*  --   --  699*  --  681*  --   HEPARINUNFRC  --   --    < >  --  0.18* 0.35 0.40  CREATININE 0.88 0.69  --   --   --  0.63  --    < > = values in this interval not displayed.    Estimated Creatinine Clearance: 54.8 mL/min (by C-G formula based on SCr of 0.63 mg/dL).  Assessment: 77 y/o F who came to the ED with cold-like symptoms, EKG reveals new onset atrial fibrillation (no prior hx per patient). Pharmacy consulted to manage IV heparin.   If does not self convert after treating PNA, may start DOAC and outpatient DCCV in 3 weeks.  Heparin level is therapeutic at 0.4 on 1150 units/hr. No bleeding noted, CBC stable.  Goal of Therapy:  Heparin level 0.3-0.7 units/ml Monitor platelets by anticoagulation protocol: Yes   Plan:  Continue heparin drip at 1150 units/hr Daily heparin level and CBC Monitor for s/sx of bleeding F/U plan for anticoagulation   Renold Genta, PharmD, BCPS Clinical Pharmacist Clinical phone for 12/17/2017 until 3p is [phone] Please check AMION for all Pharmacist numbers by unit 12/17/2017 9:56 AM

## 2017-12-17 NOTE — Progress Notes (Signed)
PROGRESS NOTE    Kayla Booth  KFM:[id number] DOB: [birth year] DOA: 12/15/2017 PCP: Helane Rima, MD   Brief Narrative:  Kayla Booth is a 77 y.o. female with medical history significant for hypertension and hereditary spherocytosis status-post splenectomy, now presenting to the emergency department for evaluation of productive cough, shortness of breath, hallucinations, and confusion.  Patient reports that she developed a cough approximately 2 weeks ago that has been productive of thick green sputum.  She has become progressively dyspneic since that time and developed some confusion with visual hallucinations over the past 2 days.  She denies chest pain, palpitations, abdominal pain, vomiting, or diarrhea.  She denies any use of alcohol or illicit substances and there has not been any new medications.  She denies any recent fall or trauma.  She has never experienced these symptoms previously.  She denies any history of GI bleed, hemorrhage, or easy bruising.  ED Course: Upon arrival to the ED, patient is found to  have a temp of 37.7 C, saturating low 90s on room air, slightly tachypneic, slightly tachycardic, and with stable blood pressure.  EKG features atrial fibrillation, apparently new.  Chemistry panel is notable for an alkaline phosphatase of 513 and total bilirubin 1.7.  CBC is notable for leukocytosis to 15,900 and thrombocytosis with platelets 753,000.  Lactic acid is reassuring at 1.68.  Blood cultures were collected, 1 L of normal saline given, and the patient was started on Rocephin and azithromycin.  She remains hemodynamically stable and will be admitted for ongoing evaluation and management of community-acquired pneumonia with new atrial fibrillation and acute encephalopathy.     Assessment & Plan:   Principal Problem:   CAP (community acquired pneumonia) Active Problems:   HTN (hypertension)   New onset atrial fibrillation ([facility])   Acute encephalopathy   Abnormal  LFTs  CAP;  - She is found to have a leukocytosis, low-grade temp, and CXR concerning for PNA  -strep pneumonia negative.  -Continue with ceftriaxone and azithromycin.  -blood culture. Pending.  Legionella antigen pending.  Follow cbc. Still at 62, if spike fever will broad antibiotics.   New A fib;  Started on IV heparin.  Continue with metoprolol/  ECHO ordered.  Cardiology consulted. Appreciate Dr Lovena Le evaluation  Hypokalemia;  Replace orally.   Acute encephalopathy;  Presents with hallucinations, confusion x2 days  TSH 1.2, B 12; 415, ammonia 27,  CT head unremarkable.  Improved.     Elevated alkaline phosphatase and bilirubin  - Alkaline phosphatase elevated to 513 and total bilirubin 1.7 on admission  -[place] negative. -LFT increased today, Alk phosphatase increase.  -GI consulted.   HTN; hold Norvasc, started on Norvasc.     DVT prophylaxis: heparin.  Code Status: full code.  Family Communication: sister who was at bedside.  Disposition Plan: home when stable.  Consultants:   Cardiology    Procedures:   ECHO   Antimicrobials:  Ceftriaxone  Azithromycin.    Subjective: She is feeling well, denies chest pain.  Cough better  Appears less confuse.   Objective: Vitals:   12/16/17 2255 12/17/17 0013 12/17/17 0315 12/17/17 0744  BP:  128/72  132/68  Pulse:  80  78  Resp:  (!) 26  (!) 21  Temp:  97.9 F (36.6 C)  97.8 F (36.6 C)  TempSrc:  Oral  Oral  SpO2: 97% 95%  94%  Weight:   67.6 kg (149 lb 0.5 oz)   Height:  Intake/Output Summary (Last 24 hours) at 12/17/2017 0936 Last data filed at 12/17/2017 0800 Gross per 24 hour  Intake 1631.83 ml  Output 800 ml  Net 831.83 ml   Filed Weights   12/15/17 2012 12/16/17 0300 12/17/17 0315  Weight: 68 kg (150 lb) 66.5 kg (146 lb 9.7 oz) 67.6 kg (149 lb 0.5 oz)    Examination:  General exam: NAD Respiratory system: Crackles bases.  Cardiovascular system: S 1, S 2  IRR Gastrointestinal system: BS present, soft, nt Central nervous system: non focal.  Extremities: Symmetric power.  Skin: No rashes     Data Reviewed: I have personally reviewed following labs and imaging studies  CBC: Recent Labs  Lab 12/15/17 2025 12/16/17 1556 12/17/17 0028  WBC 15.9* 15.7* 15.8*  NEUTROABS 11.5*  --   --   HGB 13.7 13.3 12.8  HCT 38.8 38.2 37.4  MCV 95.1 96.0 97.4  PLT 753* 699* 678*   Basic Metabolic Panel: Recent Labs  Lab 12/15/17 2025 12/16/17 0212 12/17/17 0028  NA 136 138 138  K 3.5 3.3* 4.2  CL 99 106 107  CO2 25 21* 21*  GLUCOSE 107* 106* 124*  BUN 7* 6* 6*  CREATININE 0.88 0.69 0.63  CALCIUM 8.8* 7.5* 8.5*  MG  --  1.7  --    GFR: Estimated Creatinine Clearance: 54.8 mL/min (by C-G formula based on SCr of 0.63 mg/dL). Liver Function Tests: Recent Labs  Lab 12/15/17 2025 12/16/17 0212 12/17/17 0728  AST 60* 39 153*  ALT 41 33 63*  ALKPHOS 513* 397* 528*  BILITOT 1.7* 1.3* 0.9  PROT 7.4 5.8* 6.8  ALBUMIN 3.3* 2.8* 3.0*   No results for input(s): LIPASE, AMYLASE in the last 168 hours. Recent Labs  Lab 12/16/17 0211  AMMONIA 27   Coagulation Profile: No results for input(s): INR, PROTIME in the last 168 hours. Cardiac Enzymes: No results for input(s): CKTOTAL, CKMB, CKMBINDEX, TROPONINI in the last 168 hours. BNP (last 3 results) No results for input(s): PROBNP in the last 8760 hours. HbA1C: No results for input(s): HGBA1C in the last 72 hours. CBG: No results for input(s): GLUCAP in the last 168 hours. Lipid Profile: No results for input(s): CHOL, HDL, LDLCALC, TRIG, CHOLHDL, LDLDIRECT in the last 72 hours. Thyroid Function Tests: Recent Labs    12/16/17 0211  TSH 1.234   Anemia Panel: Recent Labs    12/16/17 0211  VITAMINB12 415   Sepsis Labs: Recent Labs  Lab 12/15/17 2100 12/16/17 0058 12/16/17 0211 12/17/17 0028  PROCALCITON  --   --  0.27 0.19  LATICACIDVEN 1.68 1.43  --   --     No  results found for this or any previous visit (from the past 240 hour(s)).       Radiology Studies: Dg Chest 2 View  Result Date: 12/15/2017 CLINICAL DATA:  Cough and congestion for 2 weeks. EXAM: CHEST - 2 VIEW COMPARISON:  None. FINDINGS: Cardiomediastinal silhouette is normal. Mild interstitial prominence. Lingular patchy airspace opacity and LEFT lung base strandy densities. Small pleural effusions. No pneumothorax. Soft tissue planes and included osseous structures are non suspicious. Osteopenia. IMPRESSION: Lingular atelectasis versus pneumonia.  Small pleural effusions. Mild interstitial prominence with LEFT lung base atelectasis/scarring. Electronically Signed   By: Elon Alas M.D.   On: 12/15/2017 20:54   Ct Head Wo Contrast  Result Date: 12/16/2017 CLINICAL DATA:  77 year old female with altered mental status. EXAM: CT HEAD WITHOUT CONTRAST TECHNIQUE: Contiguous axial images were obtained from  the base of the skull through the vertex without intravenous contrast. COMPARISON:  None. FINDINGS: Brain: The ventricles and sulci appropriate size for patient's age. The gray-white matter discrimination is preserved. There is no acute intracranial hemorrhage. No mass effect or midline shift. No extra-axial fluid collection. Vascular: No hyperdense vessel or unexpected calcification. Skull: Normal. Negative for fracture or focal lesion. Sinuses/Orbits: No acute finding. Other: None IMPRESSION: Unremarkable noncontrast CT of the brain for age. Electronically Signed   By: Anner Crete M.D.   On: 12/16/2017 01:44   US Abdomen Limited Ruq  Result Date: 12/16/2017 CLINICAL DATA:  Blood ALP increased. Previous splenectomy. Hereditary spherocytosis. EXAM: ULTRASOUND ABDOMEN LIMITED RIGHT UPPER QUADRANT COMPARISON:  None. FINDINGS: Gallbladder: No gallstones or wall thickening visualized. No sonographic Murphy sign noted by sonographer. Common bile duct: Diameter: 4.4 mm. Liver: No focal lesion  identified. Within normal limits in parenchymal echogenicity. Portal vein is patent on color Doppler imaging with normal direction of blood flow towards the liver. Tiny right pleural effusion. IMPRESSION: No acute findings. Electronically Signed   By: Marin Olp M.D.   On: 12/16/2017 10:08        Scheduled Meds: . calcium-vitamin D  1 tablet Oral Q breakfast  . metoprolol tartrate  25 mg Oral BID  . potassium chloride  20 mEq Oral Once  . potassium chloride  40 mEq Oral Once  . sodium chloride flush  3 mL Intravenous Q12H   Continuous Infusions: . azithromycin Stopped (12/16/17 2325)  . cefTRIAXone (ROCEPHIN)  IV Stopped (12/16/17 2138)  . heparin 1,150 Units/hr (12/16/17 2349)     LOS: 1 day    Time spent: 35 minutes.     Elmarie Shiley, MD [facility] Pager [phone]  If 7PM-7AM, please contact night-coverage [email or web address] Password [facility] 12/17/2017, 9:36 AM

## 2017-12-17 NOTE — Progress Notes (Signed)
*  PRELIMINARY RESULTS* Echocardiogram 2D Echocardiogram has been performed.  Leavy Cella 12/17/2017, 3:04 PM

## 2017-12-17 NOTE — Progress Notes (Signed)
[place] for Heparin  Indication: atrial fibrillation  Allergies  Allergen Reactions  . Aspirin Rash   Patient Measurements: Height: 5\' 2"  (157.5 cm) Weight: 146 lb 9.7 oz (66.5 kg) IBW/kg (Calculated) : 50.1  Vital Signs: Temp: 97.9 F (36.6 C) (07/21 0013) Temp Source: Oral (07/21 0013) BP: 128/72 (07/21 0013) Pulse Rate: 80 (07/21 0013)  Labs: Recent Labs    12/15/17 2025 12/16/17 0212 12/16/17 0959 12/16/17 1556 12/16/17 1603 12/17/17 0028  HGB 13.7  --   --  13.3  --  12.8  HCT 38.8  --   --  38.2  --  37.4  PLT 753*  --   --  699*  --  681*  HEPARINUNFRC  --   --  0.34  --  0.18* 0.35  CREATININE 0.88 0.69  --   --   --  0.63    Estimated Creatinine Clearance: 54.4 mL/min (by C-G formula based on SCr of 0.63 mg/dL).  Assessment: 77 y/o F who came to the ED with cold-like symptoms, EKG reveals new onset atrial fibrillation (no prior hx per patient). Starting heparin. CBC good. Renal function good.   7/21 AM update: heparin level therapeutic x 1 after rate increase  Goal of Therapy:  Heparin level 0.3-0.7 units/ml Monitor platelets by anticoagulation protocol: Yes   Plan:  Cont heparin 1150 units/hr 0900 HL  Narda Bonds, PharmD, BCPS Clinical Pharmacist Phone: [phone]

## 2017-12-17 NOTE — Plan of Care (Signed)
Discussed with patient plan of care for the evening, pain management and hygiene with some teach back displayed.

## 2017-12-17 NOTE — Progress Notes (Signed)
Patient didn't receive all her Zithromax by IVPB due to it burning which was running with IVF to help dilute the effects with no success.  Paged MD to see if we could switch future doses to PO.

## 2017-12-17 NOTE — Progress Notes (Signed)
Progress Note  Patient Name: Kayla Booth Date of Encounter: 12/17/2017  Primary Cardiologist: No primary care provider on file.   Subjective   No chest pain, sob, or palpitations.  Inpatient Medications    Scheduled Meds: . azithromycin  500 mg Oral QHS  . calcium-vitamin D  1 tablet Oral Q breakfast  . metoprolol tartrate  25 mg Oral BID  . potassium chloride  20 mEq Oral Once  . potassium chloride  40 mEq Oral Once  . sodium chloride flush  3 mL Intravenous Q12H   Continuous Infusions: . cefTRIAXone (ROCEPHIN)  IV Stopped (12/16/17 2138)  . heparin 1,150 Units/hr (12/16/17 2349)   PRN Meds: acetaminophen **OR** acetaminophen, guaiFENesin-dextromethorphan, HYDROcodone-acetaminophen, ondansetron **OR** ondansetron (ZOFRAN) IV, senna-docusate   Vital Signs    Vitals:   12/16/17 2255 12/17/17 0013 12/17/17 0315 12/17/17 0744  BP:  128/72  132/68  Pulse:  80  78  Resp:  (!) 26  (!) 21  Temp:  97.9 F (36.6 C)  97.8 F (36.6 C)  TempSrc:  Oral  Oral  SpO2: 97% 95%  94%  Weight:   149 lb 0.5 oz (67.6 kg)   Height:        Intake/Output Summary (Last 24 hours) at 12/17/2017 1043 Last data filed at 12/17/2017 0800 Gross per 24 hour  Intake 1522.33 ml  Output 800 ml  Net 722.33 ml   Filed Weights   12/15/17 2012 12/16/17 0300 12/17/17 0315  Weight: 150 lb (68 kg) 146 lb 9.7 oz (66.5 kg) 149 lb 0.5 oz (67.6 kg)    Telemetry    Atrial fib with a controlled VR - Personally Reviewed  ECG    none - Personally Reviewed  Physical Exam   GEN: No acute distress.   Neck: 6  cm JVD Cardiac: IRIRR, no murmurs, rubs, or gallops.  Respiratory: Clear to auscultation bilaterally. GI: Soft, nontender, non-distended  MS: No edema; No deformity. Neuro:  Nonfocal  Psych: Normal affect   Labs    Chemistry Recent Labs  Lab 12/15/17 2025 12/16/17 0212 12/17/17 0028 12/17/17 0728  NA 136 138 138  --   K 3.5 3.3* 4.2  --   CL 99 106 107  --   CO2 25 21* 21*   --   GLUCOSE 107* 106* 124*  --   BUN 7* 6* 6*  --   CREATININE 0.88 0.69 0.63  --   CALCIUM 8.8* 7.5* 8.5*  --   PROT 7.4 5.8*  --  6.8  ALBUMIN 3.3* 2.8*  --  3.0*  AST 60* 39  --  153*  ALT 41 33  --  63*  ALKPHOS 513* 397*  --  528*  BILITOT 1.7* 1.3*  --  0.9  GFRNONAA >60 >60 >60  --   GFRAA >60 >60 >60  --   ANIONGAP 12 11 10   --      Hematology Recent Labs  Lab 12/15/17 2025 12/16/17 1556 12/17/17 0028  WBC 15.9* 15.7* 15.8*  RBC 4.08 3.98 3.84*  HGB 13.7 13.3 12.8  HCT 38.8 38.2 37.4  MCV 95.1 96.0 97.4  MCH 33.6 33.4 33.3  MCHC 35.3 34.8 34.2  RDW 11.4* 11.5 11.7  PLT 753* 699* 681*    Cardiac EnzymesNo results for input(s): TROPONINI in the last 168 hours. No results for input(s): TROPIPOC in the last 168 hours.   BNPNo results for input(s): BNP, PROBNP in the last 168 hours.   DDimer No results  for input(s): DDIMER in the last 168 hours.   Radiology    Dg Chest 2 View  Result Date: 12/15/2017 CLINICAL DATA:  Cough and congestion for 2 weeks. EXAM: CHEST - 2 VIEW COMPARISON:  None. FINDINGS: Cardiomediastinal silhouette is normal. Mild interstitial prominence. Lingular patchy airspace opacity and LEFT lung base strandy densities. Small pleural effusions. No pneumothorax. Soft tissue planes and included osseous structures are non suspicious. Osteopenia. IMPRESSION: Lingular atelectasis versus pneumonia.  Small pleural effusions. Mild interstitial prominence with LEFT lung base atelectasis/scarring. Electronically Signed   By: Elon Alas M.D.   On: 12/15/2017 20:54   Ct Head Wo Contrast  Result Date: 12/16/2017 CLINICAL DATA:  77 year old female with altered mental status. EXAM: CT HEAD WITHOUT CONTRAST TECHNIQUE: Contiguous axial images were obtained from the base of the skull through the vertex without intravenous contrast. COMPARISON:  None. FINDINGS: Brain: The ventricles and sulci appropriate size for patient's age. The gray-white matter  discrimination is preserved. There is no acute intracranial hemorrhage. No mass effect or midline shift. No extra-axial fluid collection. Vascular: No hyperdense vessel or unexpected calcification. Skull: Normal. Negative for fracture or focal lesion. Sinuses/Orbits: No acute finding. Other: None IMPRESSION: Unremarkable noncontrast CT of the brain for age. Electronically Signed   By: Anner Crete M.D.   On: 12/16/2017 01:44   US Abdomen Limited Ruq  Result Date: 12/16/2017 CLINICAL DATA:  Blood ALP increased. Previous splenectomy. Hereditary spherocytosis. EXAM: ULTRASOUND ABDOMEN LIMITED RIGHT UPPER QUADRANT COMPARISON:  None. FINDINGS: Gallbladder: No gallstones or wall thickening visualized. No sonographic Murphy sign noted by sonographer. Common bile duct: Diameter: 4.4 mm. Liver: No focal lesion identified. Within normal limits in parenchymal echogenicity. Portal vein is patent on color Doppler imaging with normal direction of blood flow towards the liver. Tiny right pleural effusion. IMPRESSION: No acute findings. Electronically Signed   By: Marin Olp M.D.   On: 12/16/2017 10:08    Cardiac Studies   2D echo is pending  Patient Profile     77 y.o. female admitted with Pneumonia and found to have atrial fib.   Assessment & Plan    1. Atrial fib with a controlled VR - she is asymptomatic. She could have been out of rhythm for a long time. I would suggest she be switched off of IV heparin to either Eliquis 5 mg bid, or Xarelto 20 mg daily. Continue metoprolol 25 bid. Followup with Almyra Deforest in clinic in 3-4 weeks.      For questions or updates, please contact [place] Please consult [email or web address] for contact info under Cardiology/STEMI.      Signed, Cristopher Peru, MD  12/17/2017, 10:43 AM  Patient ID: Kayla Booth, female   DOB: 13-Dec-1940, 77 y.o.   MRN: [id number]

## 2017-12-17 NOTE — Consult Note (Signed)
[facility] Consultation Note  Referring Provider: Dr. Niel Hummer Primary Care Physician:  Helane Rima, MD  Reason for Consultation:  Elevated LFTs.  HPI: Kayla Booth is a 77 y.o. female admitted for shortness of breath and altered mental status.  Found to have pneumonia; mental status is improving.  Asked to see for elevated LFTs.  She reports prior history elevated LFTs many years ago, doesn't recall cause but thinks things resolved on their own.  No alcohol.  No family history liver disease.  Many year history of intermittent upper abdominal pain.   No jaundice, blood in stool.  No prior endoscopy or colonoscopy.  No change in bowel habits.  No unintentional weight loss.   Past Medical History:  Diagnosis Date  . Hereditary spherocytosis ([place])   . HTN (hypertension)   . Hyperlipidemia     Past Surgical History:  Procedure Laterality Date  . SPLENECTOMY      Prior to Admission medications   Medication Sig Start Date End Date Taking? Authorizing Provider  acetaminophen (TYLENOL) 500 MG tablet Take 500 mg by mouth every 6 (six) hours as needed for pain.   Yes [provider]  amLODipine (NORVASC) 5 MG tablet Take 5 mg by mouth daily.   Yes [provider]  calcium citrate-vitamin D 500-400 MG-UNIT chewable tablet Chew 1 tablet by mouth daily.   Yes [provider]  cholecalciferol (VITAMIN D) 400 units TABS tablet Take 400 Units by mouth daily.   Yes [provider]  Cyanocobalamin (VITAMIN B-12 PO) Take 1 tablet by mouth daily.   Yes [provider]    Current Facility-Administered Medications  Medication Dose Route Frequency Provider Last Rate Last Dose  . acetaminophen (TYLENOL) tablet 650 mg  650 mg Oral Q6H PRN Opyd, Ilene Qua, MD   650 mg at 12/16/17 2706   Or  . acetaminophen (TYLENOL) suppository 650 mg  650 mg Rectal Q6H PRN Opyd, Ilene Qua, MD      . azithromycin (ZITHROMAX) tablet 500 mg  500 mg Oral  [facility] [place], Jennifer D, RPH      . calcium-vitamin D (OSCAL WITH D) 500-200 MG-UNIT per tablet 1 tablet  1 tablet Oral Q breakfast Opyd, Ilene Qua, MD   1 tablet at 12/17/17 0852  . cefTRIAXone (ROCEPHIN) 1 g in sodium chloride 0.9 % 100 mL IVPB  1 g Intravenous QHS Opyd, Ilene Qua, MD   Stopped at 12/16/17 2138  . feeding supplement (ENSURE ENLIVE) (ENSURE ENLIVE) liquid 237 mL  237 mL Oral BID BM Regalado, Belkys A, MD      . guaiFENesin-dextromethorphan (ROBITUSSIN DM) 100-10 MG/5ML syrup 5 mL  5 mL Oral Q4H PRN Regalado, Belkys A, MD   5 mL at 12/16/17 2255  . heparin ADULT infusion 100 units/mL (25000 units/280mL sodium chloride 0.45%)  1,150 Units/hr Intravenous Continuous Wynell Balloon, RPH 11.5 mL/hr at 12/16/17 2349 1,150 Units/hr at 12/16/17 2349  . HYDROcodone-acetaminophen (NORCO/VICODIN) 5-325 MG per tablet 1-2 tablet  1-2 tablet Oral Q4H PRN Opyd, Ilene Qua, MD      . metoprolol tartrate (LOPRESSOR) tablet 25 mg  25 mg Oral BID Regalado, Belkys A, MD   25 mg at 12/17/17 0852  . ondansetron (ZOFRAN) tablet 4 mg  4 mg Oral Q6H PRN Opyd, Ilene Qua, MD       Or  . ondansetron (ZOFRAN) injection 4 mg  4 mg Intravenous Q6H PRN Opyd, Ilene Qua, MD      . potassium  chloride SA (K-DUR,KLOR-CON) CR tablet 20 mEq  20 mEq Oral Once Opyd, Ilene Qua, MD      . potassium chloride SA (K-DUR,KLOR-CON) CR tablet 40 mEq  40 mEq Oral Once Regalado, Belkys A, MD      . senna-docusate (Senokot-S) tablet 1 tablet  1 tablet Oral QHS PRN Opyd, Ilene Qua, MD      . sodium chloride flush (NS) 0.9 % injection 3 mL  3 mL Intravenous Q12H Opyd, Ilene Qua, MD   3 mL at 12/17/17 0853    Allergies as of 12/15/2017 - Review Complete 12/15/2017  Allergen Reaction Noted  . Aspirin Rash 09/20/2012    Family History  Problem Relation Age of Onset  . Heart failure Unknown     Social History   Socioeconomic History  . Marital status: Divorced    Spouse name: Not on file  . Number of children: Not on file   . Years of education: Not on file  . Highest education level: Not on file  Occupational History  . Not on file  Social Needs  . Financial resource strain: Not on file  . Food insecurity:    Worry: Not on file    Inability: Not on file  . Transportation needs:    Medical: Not on file    Non-medical: Not on file  Tobacco Use  . Smoking status: Never Smoker  . Smokeless tobacco: Never Used  Substance and Sexual Activity  . Alcohol use: No  . Drug use: No  . Sexual activity: Not on file  Lifestyle  . Physical activity:    Days per week: Not on file    Minutes per session: Not on file  . Stress: Not on file  Relationships  . Social connections:    Talks on phone: Not on file    Gets together: Not on file    Attends religious service: Not on file    Active member of club or organization: Not on file    Attends meetings of clubs or organizations: Not on file    Relationship status: Not on file  . Intimate partner violence:    Fear of current or ex partner: Not on file    Emotionally abused: Not on file    Physically abused: Not on file    Forced sexual activity: Not on file  Other Topics Concern  . Not on file  Social History Narrative  . Not on file    Review of Systems: As per HPI, all others negative  Physical Exam: Vital signs in last 24 hours: Temp:  [97.4 F (36.3 C)-97.9 F (36.6 C)] 97.8 F (36.6 C) (07/21 0744) Pulse Rate:  [78-96] 78 (07/21 0744) Resp:  [15-26] 21 (07/21 0744) BP: (111-132)/(58-73) 132/68 (07/21 0744) SpO2:  [94 %-97 %] 94 % (07/21 0744) Weight:  [67.6 kg (149 lb 0.5 oz)] 67.6 kg (149 lb 0.5 oz) (07/21 0315) Last BM Date: 12/15/17 General:   Alert,  Well-developed, well-nourished, pleasant and cooperative in NAD Head:  Normocephalic and atraumatic. Eyes:  Sclera clear, no icterus.   Conjunctiva pink. Ears:  Normal auditory acuity. Nose:  No deformity, discharge,  or lesions. Mouth:  No deformity or lesions.  Oropharynx pink &  moist. Neck:  Supple; no masses or thyromegaly. Lungs:  Wheezes and ronchi upper lung fields; clear elsewhere.   No other wheezes, crackles, or rhonchi. No acute distress. Heart:  Regular rate and rhythm; no murmurs, clicks, rubs,  or gallops. Abdomen:  Soft,  nontender and nondistended. No masses, hepatosplenomegaly or hernias noted. Normal bowel sounds, without guarding, and without rebound.     Msk:  Symmetrical without gross deformities. Normal posture. Pulses:  Normal pulses noted. Extremities:  Without clubbing or edema. Neurologic:  Alert and  oriented x4;  grossly normal neurologically. Skin:  Intact without significant lesions or rashes. Psych:  Alert and cooperative. Normal mood and affect.   Lab Results: Recent Labs    12/15/17 2025 12/16/17 1556 12/17/17 0028  WBC 15.9* 15.7* 15.8*  HGB 13.7 13.3 12.8  HCT 38.8 38.2 37.4  PLT 753* 699* 681*   BMET Recent Labs    12/15/17 2025 12/16/17 0212 12/17/17 0028  NA 136 138 138  K 3.5 3.3* 4.2  CL 99 106 107  CO2 25 21* 21*  GLUCOSE 107* 106* 124*  BUN 7* 6* 6*  CREATININE 0.88 0.69 0.63  CALCIUM 8.8* 7.5* 8.5*   LFT Recent Labs    12/17/17 0728  PROT 6.8  ALBUMIN 3.0*  AST 153*  ALT 63*  ALKPHOS 528*  BILITOT 0.9  BILIDIR 0.5*  IBILI 0.4   PT/INR No results for input(s): LABPROT, INR in the last 72 hours.  Studies/Results: Dg Chest 2 View  Result Date: 12/15/2017 CLINICAL DATA:  Cough and congestion for 2 weeks. EXAM: CHEST - 2 VIEW COMPARISON:  None. FINDINGS: Cardiomediastinal silhouette is normal. Mild interstitial prominence. Lingular patchy airspace opacity and LEFT lung base strandy densities. Small pleural effusions. No pneumothorax. Soft tissue planes and included osseous structures are non suspicious. Osteopenia. IMPRESSION: Lingular atelectasis versus pneumonia.  Small pleural effusions. Mild interstitial prominence with LEFT lung base atelectasis/scarring. Electronically Signed   By: Elon Alas M.D.   On: 12/15/2017 20:54   Ct Head Wo Contrast  Result Date: 12/16/2017 CLINICAL DATA:  77 year old female with altered mental status. EXAM: CT HEAD WITHOUT CONTRAST TECHNIQUE: Contiguous axial images were obtained from the base of the skull through the vertex without intravenous contrast. COMPARISON:  None. FINDINGS: Brain: The ventricles and sulci appropriate size for patient's age. The gray-white matter discrimination is preserved. There is no acute intracranial hemorrhage. No mass effect or midline shift. No extra-axial fluid collection. Vascular: No hyperdense vessel or unexpected calcification. Skull: Normal. Negative for fracture or focal lesion. Sinuses/Orbits: No acute finding. Other: None IMPRESSION: Unremarkable noncontrast CT of the brain for age. Electronically Signed   By: Anner Crete M.D.   On: 12/16/2017 01:44   US Abdomen Limited Ruq  Result Date: 12/16/2017 CLINICAL DATA:  Blood ALP increased. Previous splenectomy. Hereditary spherocytosis. EXAM: ULTRASOUND ABDOMEN LIMITED RIGHT UPPER QUADRANT COMPARISON:  None. FINDINGS: Gallbladder: No gallstones or wall thickening visualized. No sonographic Murphy sign noted by sonographer. Common bile duct: Diameter: 4.4 mm. Liver: No focal lesion identified. Within normal limits in parenchymal echogenicity. Portal vein is patent on color Doppler imaging with normal direction of blood flow towards the liver. Tiny right pleural effusion. IMPRESSION: No acute findings. Electronically Signed   By: Marin Olp M.D.   On: 12/16/2017 10:08   Impression:  1.  Community-acquired pneumonia. 2.  Altered mental status, improving. 3.  New-onset atrial fibrillation. 4.  Elevated LFTs; prior mild elevation years ago; improving over past 24 hours; now cholestatic pattern. Do not suspect liver failure.  Unclear etiology.  Ceftriaxone can cause this pattern, but her LFTs were elevated upon admission and seemingly before this medication.     Plan:  1.  Serologic evaluation. Check INR.   2.  Consider  switch ceftriaxone to another agent, if possible, to avoid any confounding medication effect from other causes. 3.  Likely follow-up as outpatient and if serologies (some of which can take days-to-weeks to return) as unrevealing and LFTs don't improve, might consider outpatient liver biopsy. 4.  [facility] will follow.   LOS: 1 day   Deno Sida M  12/17/2017, 12:27 PM  Cell [phone] If no answer or after 5 PM call [phone]

## 2017-12-18 ENCOUNTER — Inpatient Hospital Stay (HOSPITAL_COMMUNITY): Payer: Medicare HMO

## 2017-12-18 ENCOUNTER — Telehealth: Payer: Self-pay | Admitting: Family

## 2017-12-18 DIAGNOSIS — I10 Essential (primary) hypertension: Secondary | ICD-10-CM

## 2017-12-18 DIAGNOSIS — J189 Pneumonia, unspecified organism: Secondary | ICD-10-CM

## 2017-12-18 DIAGNOSIS — R441 Visual hallucinations: Secondary | ICD-10-CM

## 2017-12-18 DIAGNOSIS — R748 Abnormal levels of other serum enzymes: Secondary | ICD-10-CM

## 2017-12-18 DIAGNOSIS — Z886 Allergy status to analgesic agent status: Secondary | ICD-10-CM

## 2017-12-18 DIAGNOSIS — R410 Disorientation, unspecified: Secondary | ICD-10-CM

## 2017-12-18 LAB — COMPREHENSIVE METABOLIC PANEL
ALT: 49 U/L — ABNORMAL HIGH (ref 0–44)
AST: 53 U/L — ABNORMAL HIGH (ref 15–41)
Albumin: 2.8 g/dL — ABNORMAL LOW (ref 3.5–5.0)
Alkaline Phosphatase: 450 U/L — ABNORMAL HIGH (ref 38–126)
Anion gap: 11 (ref 5–15)
BUN: <5 mg/dL — ABNORMAL LOW (ref 8–23)
CO2: 29 mmol/L (ref 22–32)
Calcium: 8.7 mg/dL — ABNORMAL LOW (ref 8.9–10.3)
Chloride: 102 mmol/L (ref 98–111)
Creatinine, Ser: 0.84 mg/dL (ref 0.44–1.00)
GFR calc Af Amer: >60 mL/min (ref >60)
GFR calc non Af Amer: >60 mL/min (ref >60)
Glucose, Bld: 107 mg/dL — ABNORMAL HIGH (ref 70–99)
Potassium: 3.8 mmol/L (ref 3.5–5.1)
Sodium: 142 mmol/L (ref 135–145)
Total Bilirubin: 1 mg/dL (ref 0.3–1.2)
Total Protein: 6.4 g/dL — ABNORMAL LOW (ref 6.5–8.1)

## 2017-12-18 LAB — CBC WITH DIFFERENTIAL/PLATELET
Abs Immature Granulocytes: 0.2 10*3/uL — ABNORMAL HIGH (ref 0.0–0.1)
Basophils Absolute: 0.1 10*3/uL (ref 0.0–0.1)
Basophils Relative: 1 %
Eosinophils Absolute: 0.3 10*3/uL (ref 0.0–0.7)
Eosinophils Relative: 2 %
HCT: 37.7 % (ref 36.0–46.0)
Hemoglobin: 12.8 g/dL (ref 12.0–15.0)
Immature Granulocytes: 1 %
Lymphocytes Relative: 20 %
Lymphs Abs: 3.2 10*3/uL (ref 0.7–4.0)
MCH: 33.2 pg (ref 26.0–34.0)
MCHC: 34 g/dL (ref 30.0–36.0)
MCV: 97.7 fL (ref 78.0–100.0)
Monocytes Absolute: 1.2 10*3/uL — ABNORMAL HIGH (ref 0.1–1.0)
Monocytes Relative: 7 %
Neutro Abs: 11.2 10*3/uL — ABNORMAL HIGH (ref 1.7–7.7)
Neutrophils Relative %: 69 %
Platelets: 779 10*3/uL — ABNORMAL HIGH (ref 150–400)
RBC: 3.86 MIL/uL — ABNORMAL LOW (ref 3.87–5.11)
RDW: 11.8 % (ref 11.5–15.5)
WBC: 16.1 10*3/uL — ABNORMAL HIGH (ref 4.0–10.5)

## 2017-12-18 LAB — IGG, IGA, IGM
IgA: 456 mg/dL — ABNORMAL HIGH (ref 64–422)
IgG (Immunoglobin G), Serum: 1084 mg/dL (ref 700–1600)
IgM (Immunoglobulin M), Srm: 64 mg/dL (ref 26–217)

## 2017-12-18 LAB — HEPATITIS B SURFACE ANTIBODY,QUALITATIVE: Hep B S Ab: NONREACTIVE

## 2017-12-18 LAB — FOLATE RBC
Folate, Hemolysate: 226.3 ng/mL
Folate, RBC: 660 ng/mL (ref >498)
Hematocrit: 34.3 % (ref 34.0–46.6)

## 2017-12-18 LAB — HEPATITIS B CORE ANTIBODY, IGM: Hep B C IgM: NEGATIVE

## 2017-12-18 LAB — HEPATITIS B CORE ANTIBODY, TOTAL: Hep B Core Total Ab: NEGATIVE

## 2017-12-18 LAB — PROTIME-INR
INR: 1.07
Prothrombin Time: 13.8 seconds (ref 11.4–15.2)

## 2017-12-18 LAB — LEGIONELLA PNEUMOPHILA SEROGP 1 UR AG: L. pneumophila Serogp 1 Ur Ag: NEGATIVE

## 2017-12-18 LAB — HEPATITIS C ANTIBODY: HCV Ab: <0.1 s/co ratio (ref 0.0–0.9)

## 2017-12-18 LAB — HEPATITIS A ANTIBODY, TOTAL: Hep A Total Ab: NEGATIVE

## 2017-12-18 LAB — CERULOPLASMIN: Ceruloplasmin: 40.2 mg/dL — ABNORMAL HIGH (ref 19.0–39.0)

## 2017-12-18 LAB — HEPATITIS B SURFACE ANTIGEN: Hepatitis B Surface Ag: NEGATIVE

## 2017-12-18 MED ORDER — APIXABAN 5 MG PO TABS
5.0000 mg | ORAL_TABLET | Freq: Two times a day (BID) | ORAL | Status: DC
  Administered 2017-12-18 – 2017-12-19 (×3): 5 mg via ORAL
  Filled 2017-12-18 (×3): qty 1

## 2017-12-18 MED ORDER — GUAIFENESIN ER 600 MG PO TB12
600.0000 mg | ORAL_TABLET | Freq: Two times a day (BID) | ORAL | Status: DC
  Administered 2017-12-18 – 2017-12-19 (×3): 600 mg via ORAL
  Filled 2017-12-18 (×3): qty 1

## 2017-12-18 NOTE — Progress Notes (Addendum)
Progress Note  Patient Name: Kayla Booth Date of Encounter: 12/18/2017  Primary Cardiologist: Candee Furbish, MD   Subjective   No CP SOB no palps. Thinks PNA started all of this.  Inpatient Medications    Scheduled Meds: . azithromycin  500 mg Oral QHS  . calcium-vitamin D  1 tablet Oral Q breakfast  . feeding supplement (ENSURE ENLIVE)  237 mL Oral BID BM  . metoprolol tartrate  25 mg Oral BID  . potassium chloride  20 mEq Oral Once  . potassium chloride  40 mEq Oral Once  . sodium chloride flush  3 mL Intravenous Q12H   Continuous Infusions: . heparin 1,150 Units/hr (12/18/17 0634)  . piperacillin-tazobactam (ZOSYN)  IV 3.375 g (12/18/17 0043)   PRN Meds: acetaminophen **OR** acetaminophen, guaiFENesin-dextromethorphan, HYDROcodone-acetaminophen, ondansetron **OR** ondansetron (ZOFRAN) IV, senna-docusate   Vital Signs    Vitals:   12/17/17 2138 12/17/17 2139 12/18/17 0333 12/18/17 0720  BP: 133/69 133/69  130/66  Pulse: (!) 105 (!) 117  100  Resp:  18  16  Temp:  98.7 F (37.1 C)  97.6 F (36.4 C)  TempSrc:  Oral  Oral  SpO2:  97%  98%  Weight:   144 lb 13.5 oz (65.7 kg)   Height:        Intake/Output Summary (Last 24 hours) at 12/18/2017 0856 Last data filed at 12/18/2017 0850 Gross per 24 hour  Intake 582.75 ml  Output -  Net 582.75 ml   Filed Weights   12/16/17 0300 12/17/17 0315 12/18/17 0333  Weight: 146 lb 9.7 oz (66.5 kg) 149 lb 0.5 oz (67.6 kg) 144 lb 13.5 oz (65.7 kg)    Telemetry    AFIB rate controlled - Personally Reviewed  ECG    No new - Personally Reviewed  Physical Exam   GEN: No acute distress.   Neck: No JVD Cardiac: IRRR, no murmurs, rubs, or gallops.  Respiratory: Clear to auscultation bilaterally. GI: Soft, nontender, non-distended  MS: No edema; No deformity. Neuro:  Nonfocal  Psych: Normal affect   Labs    Chemistry Recent Labs  Lab 12/16/17 0212 12/17/17 0028 12/17/17 0728 12/18/17 0213  NA 138 138  --   142  K 3.3* 4.2  --  3.8  CL 106 107  --  102  CO2 21* 21*  --  29  GLUCOSE 106* 124*  --  107*  BUN 6* 6*  --  <5*  CREATININE 0.69 0.63  --  0.84  CALCIUM 7.5* 8.5*  --  8.7*  PROT 5.8*  --  6.8 6.4*  ALBUMIN 2.8*  --  3.0* 2.8*  AST 39  --  153* 53*  ALT 33  --  63* 49*  ALKPHOS 397*  --  528* 450*  BILITOT 1.3*  --  0.9 1.0  GFRNONAA >60 >60  --  >60  GFRAA >60 >60  --  >60  ANIONGAP 11 10  --  11     Hematology Recent Labs  Lab 12/16/17 1556 12/17/17 0028 12/18/17 0213  WBC 15.7* 15.8* 16.1*  RBC 3.98 3.84* 3.86*  HGB 13.3 12.8 12.8  HCT 38.2 37.4 37.7  MCV 96.0 97.4 97.7  MCH 33.4 33.3 33.2  MCHC 34.8 34.2 34.0  RDW 11.5 11.7 11.8  PLT 699* 681* 779*    Cardiac EnzymesNo results for input(s): TROPONINI in the last 168 hours. No results for input(s): TROPIPOC in the last 168 hours.   BNPNo results for input(s):  BNP, PROBNP in the last 168 hours.   DDimer No results for input(s): DDIMER in the last 168 hours.   Radiology    US Abdomen Limited Ruq  Result Date: 12/16/2017 CLINICAL DATA:  Blood ALP increased. Previous splenectomy. Hereditary spherocytosis. EXAM: ULTRASOUND ABDOMEN LIMITED RIGHT UPPER QUADRANT COMPARISON:  None. FINDINGS: Gallbladder: No gallstones or wall thickening visualized. No sonographic Murphy sign noted by sonographer. Common bile duct: Diameter: 4.4 mm. Liver: No focal lesion identified. Within normal limits in parenchymal echogenicity. Portal vein is patent on color Doppler imaging with normal direction of blood flow towards the liver. Tiny right pleural effusion. IMPRESSION: No acute findings. Electronically Signed   By: Marin Olp M.D.   On: 12/16/2017 10:08    Cardiac Studies   - Left ventricle: The cavity size was normal. Systolic function was   normal. The estimated ejection fraction was in the range of 50%   to 55%. Wall motion was normal; there were no regional wall   motion abnormalities. - Aortic valve: There was mild  regurgitation. - Mitral valve: There was trivial regurgitation. - Right ventricle: Systolic function was normal. - Atrial septum: No defect or patent foramen ovale was identified. - Tricuspid valve: There was trivial regurgitation. Peak RV-RA   gradient (S): 19 mm Hg. - Pulmonic valve: There was no significant regurgitation. - Pulmonary arteries: Systolic pressure was within the normal   range. PA peak pressure: 22 mm Hg (S).  Impressions:  - Atrial fibrillation throughout study. Normal LV function. Mild   AR, trivial MR/TR.  Patient Profile     77 y.o. female with PAF, elevated LFT's, PNA  Assessment & Plan    PAF  - no symptoms  - reviewed Dr. Tanna Furry note. Agree. Change to "either Eliquis 5 mg bid, or Xarelto 20 mg daily. Continue metoprolol 25 bid.   - I will change to Eliquis.   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   - EF normal  - In 4 weeks we will decide on DCCV.    Elevated LFT's  - per GI  PNA  - per main team.   [facility] will sign off.   Medication Recommendations:  As above Other recommendations (labs, testing, etc):  None  Follow up as an outpatient:  4 weeks with APP.   For questions or updates, please contact [place] Please consult [email or web address] for contact info under Cardiology/STEMI.      Signed, Candee Furbish, MD  12/18/2017, 8:56 AM

## 2017-12-18 NOTE — Consult Note (Signed)
[facility] for [facility]ious [facility]    Date of Admission:  12/15/2017     Total days of antibiotics 4               Reason for Consult: Pneumonia  Referring Provider: [place] Primary Care Provider: Helane Rima, MD   Assessment/Plan:  Kayla Booth is a 77 y/o female with previous medical history of hypertension admitted to the hospital with the chief complaint of cold like symptoms and productive cough. Afebrile at home. X-ray with atelectasis versus pneumonia. Legionella and Strep pneumoniae negative.  CT scan with nodular airspace disease with concern for possible multifocal or atypical pneumonia. Initially on azithromycin and ceftriaxone. Due to concern for elevated liver enzymes changed ceftriaxone to piperacillin-tazobactam.   Community acquired pneumonia - Currently on Day 4 of antimicrobial therapy. Recommend obtaining a sputum culture as there is concern for possible atypical pneumonia/MAI. Would stop antimicrobial therapy now as she should be well treated. Continue to follow cultures.   Abnormal LFTs - Elevated liver enzymes on admission. GI following with potential concern for ceftriaxone contributing to current elevation. Lab work shows numbers are improving today.    Principal Problem:   CAP (community acquired pneumonia) Active Problems:   HTN (hypertension)   New onset atrial fibrillation (HCC)   Acute encephalopathy   Abnormal LFTs   . apixaban  5 mg Oral BID  . azithromycin  500 mg Oral QHS  . calcium-vitamin D  1 tablet Oral Q breakfast  . feeding supplement (ENSURE ENLIVE)  237 mL Oral BID BM  . guaiFENesin  600 mg Oral BID  . metoprolol tartrate  25 mg Oral BID  . potassium chloride  20 mEq Oral Once  . potassium chloride  40 mEq Oral Once  . sodium chloride flush  3 mL Intravenous Q12H     HPI: Kayla Booth is a 77 y.o. female with a previous medical history of hypertension presented to the ED on 12/15/17 with the chief complaint of cold like  symptoms and a productive cough with green-yellow mucus. Also experiencing hallucinations described as puzzle pieces. Chest x-ray showing lingular atelectasis versus pneumonia with small pleural effusions. CT of the head had no acute intracranial pathology. She was started on Azithromycin and Ceftriaxone. On admission her temperature was slightly elevated at 99.2 and she was tachycardic at 124. AST was 60 and ALP was 513 with a WBC count of 15.9. Urine strep pneumo and legionella were negative. US of the liver with no focal lesions or acute findings. GI recommended changing off ceftriaxone as this could potentate the raise in liver function tests. Ceftriaxone was changed to Zoysn.  She has been afebrile since admission with a stable leukocytosis which was slightly elevated today. Repeat chest x-ray today with irregular nodular density and stable opacities at each lung base. CT of the chest with clustered nodular densities in the right lower lobe concerning for infectious process possibly multifocal pneumonia or atypical infection.   Feeling better since being in the hospital. Describes initially a green-yellow sputum production that has now become clear.    Review of Systems: Review of Systems  Constitutional: Negative for chills, diaphoresis and fever.  Respiratory: Positive for cough and sputum production. Negative for shortness of breath and wheezing.   Cardiovascular: Negative for chest pain and leg swelling.  Gastrointestinal: Negative for abdominal pain, constipation, diarrhea, nausea and vomiting.  Skin: Negative for rash.  Neurological: Negative for dizziness and headaches.     Past  Medical History:  Diagnosis Date  . 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 HTN (hypertension)   . Hyperlipidemia     Social History   Tobacco Use  . Smoking status: Never Smoker  . Smokeless tobacco: Never Used  Substance Use Topics  . Alcohol use: No  . Drug use: No    Family History  Problem  Relation Age of Onset  . Heart failure Unknown     Allergies  Allergen Reactions  . Aspirin Rash    OBJECTIVE: Blood pressure 135/74, pulse 100, temperature 97.6 F (36.4 C), temperature source Oral, resp. rate 16, height 5\' 2"  (1.575 m), weight 144 lb 13.5 oz (65.7 kg), SpO2 98 %.  Physical Exam  Constitutional: She is oriented to person, place, and time. She appears well-developed and well-nourished. No distress.  Lying in bed with head of bed elevated. Pleasant  Neck: Neck supple.  Cardiovascular: Normal rate, regular rhythm, normal heart sounds and intact distal pulses.  Pulmonary/Chest: Effort normal. No stridor. No respiratory distress. She has wheezes. She has no rales. She exhibits no tenderness.  Abdominal: Soft. Bowel sounds are normal. She exhibits no distension.  Lymphadenopathy:    She has no cervical adenopathy.  Neurological: She is alert and oriented to person, place, and time.  Skin: Skin is warm and dry.  Psychiatric: She has a normal mood and affect. Her behavior is normal.    Lab Results Lab Results  Component Value Date   WBC 16.1 (H) 12/18/2017   HGB 12.8 12/18/2017   HCT 37.7 12/18/2017   MCV 97.7 12/18/2017   PLT 779 (H) 12/18/2017    Lab Results  Component Value Date   CREATININE 0.84 12/18/2017   BUN <5 (L) 12/18/2017   NA 142 12/18/2017   K 3.8 12/18/2017   CL 102 12/18/2017   CO2 29 12/18/2017    Lab Results  Component Value Date   ALT 49 (H) 12/18/2017   AST 53 (H) 12/18/2017   ALKPHOS 450 (H) 12/18/2017   BILITOT 1.0 12/18/2017     Microbiology: Recent Results (from the past 240 hour(s))  Blood culture (routine x 2)     Status: None (Preliminary result)   Collection Time: 12/16/17 12:52 AM  Result Value Ref Range Status   Specimen Description BLOOD LEFT WRIST  Final   Special Requests   Final    BOTTLES DRAWN AEROBIC AND ANAEROBIC Blood Culture results may not be optimal due to an inadequate volume of blood received in culture  bottles   Culture   Final    NO GROWTH 1 DAY Performed at [facility], [place] [address] [place], [place] [address]    Report Status PENDING  Incomplete  Blood culture (routine x 2)     Status: None (Preliminary result)   Collection Time: 12/16/17 12:53 AM  Result Value Ref Range Status   Specimen Description BLOOD LEFT HAND  Final   Special Requests   Final    BOTTLES DRAWN AEROBIC AND ANAEROBIC Blood Culture results may not be optimal due to an inadequate volume of blood received in culture bottles   Culture   Final    NO GROWTH 1 DAY Performed at [facility], [place] [address], [place], [place] [address]    Report Status PENDING  Incomplete     Terri Piedra, [place] [facility] [place] Pager  12/18/2017  2:37 PM

## 2017-12-18 NOTE — Telephone Encounter (Signed)
Error

## 2017-12-18 NOTE — Progress Notes (Signed)
Nils Pyle, RN        # 8.  S/W [facility] # [phone]   1. ELIQUIS  2.5 MG BID  COVER- YES  CO-PAY- $ 45.00  TIER- 3 DRUG  PRIOR APPROVAL- NO   2. ELIQUIS 5 MG BID  COVER- YES  CO-PAY- $ 45.00  TIER- 3 DRUG  PRIOR APPROVAL- NO   3 .XARELTO 15 MG BID  COVER- YES  CO-PAY- $ 45.00  TIER- 3 DRUG  PRIOR APPROVAL- NO    4. XARELTO 20 MG DAILY  COVER- YES  CO-PAY- $ 45.00  TIER- 3 DRUG  PRIOR APPROVAL- NO

## 2017-12-18 NOTE — Progress Notes (Signed)
PROGRESS NOTE    Kayla Booth  WUJ:[id number] DOB: 1940-09-08 DOA: 12/15/2017 PCP: Helane Rima, MD   Brief Narrative:  Kayla Booth is a 77 y.o. female with medical history significant for hypertension and hereditary spherocytosis status-post splenectomy, now presenting to the emergency department for evaluation of productive cough, shortness of breath, hallucinations, and confusion.  Patient reports that she developed a cough approximately 2 weeks ago that has been productive of thick green sputum.  She has become progressively dyspneic since that time and developed some confusion with visual hallucinations over the past 2 days.  She denies chest pain, palpitations, abdominal pain, vomiting, or diarrhea.  She denies any use of alcohol or illicit substances and there has not been any new medications.  She denies any recent fall or trauma.  She has never experienced these symptoms previously.  She denies any history of GI bleed, hemorrhage, or easy bruising.  ED Course: Upon arrival to the ED, patient is found to  have a temp of 37.7 C, saturating low 90s on room air, slightly tachypneic, slightly tachycardic, and with stable blood pressure.  EKG features atrial fibrillation, apparently new.  Chemistry panel is notable for an alkaline phosphatase of 513 and total bilirubin 1.7.  CBC is notable for leukocytosis to 15,900 and thrombocytosis with platelets 753,000.  Lactic acid is reassuring at 1.68.  Blood cultures were collected, 1 L of normal saline given, and the patient was started on Rocephin and azithromycin.  She remains hemodynamically stable and will be admitted for ongoing evaluation and management of community-acquired pneumonia with new atrial fibrillation and acute encephalopathy.     Assessment & Plan:   Principal Problem:   CAP (community acquired pneumonia) Active Problems:   HTN (hypertension)   New onset atrial fibrillation (HCC)   Acute encephalopathy   Abnormal  LFTs  CAP;  - She is found to have a leukocytosis, low-grade temp, and CXR concerning for PNA  -strep pneumonia negative.  -Continue with azithromycin. Ceftriaxone change to zosyn 7-21 to avoid liver toxicity -Blood culture no growth to date.  -Legionella antigen negative -WBC increasing. Repeated chest x ray; with pulmonary nodule. CT chest order  now showed multifocal PNA>  ID consulted due to persistent leukocytosis in asplenic patient.   New A fib;  Transition to eliquis.  Continue with metoprolol/  ECHO EF; Atrial fibrillation throughout study. Normal LV function. Mild AR, trivial MR/TR. Cardiology consulted. Appreciate Dr Lovena Le evaluation  Hypokalemia;  Resolved.   Acute encephalopathy; metabolic Presents with hallucinations, confusion x2 days  TSH 1.2, B 12; 415, ammonia 27,  CT head unremarkable.  Improved.     Elevated alkaline phosphatase and bilirubin  - Alkaline phosphatase elevated to 513 and total bilirubin 1.7 on admission  -[place] negative. -LFT trending down.  -GI consulted.  -Hepatitis panel p.  Antis smooth , anti mitochondrial, ANA, ceruloplasmin, IgG, IgA, Reticullin antibody, anti gliandin   HTN; hold Norvasc, started on metoprolol.     DVT prophylaxis: heparin.  Code Status: full code.  Family Communication: no family at bedside.  Disposition Plan: home when stable.  Consultants:   Cardiology    Procedures:   ECHO   Antimicrobials:  Ceftriaxone  Azithromycin.    Subjective: Feels better today  phlegm thicker  Objective: Vitals:   12/17/17 2139 12/18/17 0333 12/18/17 0720 12/18/17 0951  BP: 133/69  130/66 135/74  Pulse: (!) 117  100 100  Resp: 18  16   Temp: 98.7 F (37.1 C)  97.6 F (36.4 C)   TempSrc: Oral  Oral   SpO2: 97%  98%   Weight:  65.7 kg (144 lb 13.5 oz)    Height:        Intake/Output Summary (Last 24 hours) at 12/18/2017 0956 Last data filed at 12/18/2017 0850 Gross per 24 hour  Intake 582.75 ml  Output  -  Net 582.75 ml   Filed Weights   12/16/17 0300 12/17/17 0315 12/18/17 0333  Weight: 66.5 kg (146 lb 9.7 oz) 67.6 kg (149 lb 0.5 oz) 65.7 kg (144 lb 13.5 oz)    Examination:  General exam: NAD Respiratory system: Normal respiratory effort, crackles.  Cardiovascular system: S 1, S 2 RRR Gastrointestinal system: BS present, soft, nt Central nervous system: non focal   Extremities: Symmetric power.  Skin: No rashes     Data Reviewed: I have personally reviewed following labs and imaging studies  CBC: Recent Labs  Lab 12/15/17 2025 12/16/17 1556 12/17/17 0028 12/18/17 0213  WBC 15.9* 15.7* 15.8* 16.1*  NEUTROABS 11.5*  --   --  11.2*  HGB 13.7 13.3 12.8 12.8  HCT 38.8 38.2 37.4 37.7  MCV 95.1 96.0 97.4 97.7  PLT 753* 699* 681* 308*   Basic Metabolic Panel: Recent Labs  Lab 12/15/17 2025 12/16/17 0212 12/17/17 0028 12/18/17 0213  NA 136 138 138 142  K 3.5 3.3* 4.2 3.8  CL 99 106 107 102  CO2 25 21* 21* 29  GLUCOSE 107* 106* 124* 107*  BUN 7* 6* 6* <5*  CREATININE 0.88 0.69 0.63 0.84  CALCIUM 8.8* 7.5* 8.5* 8.7*  MG  --  1.7  --   --    GFR: Estimated Creatinine Clearance: 51.4 mL/min (by C-G formula based on SCr of 0.84 mg/dL). Liver Function Tests: Recent Labs  Lab 12/15/17 2025 12/16/17 0212 12/17/17 0728 12/18/17 0213  AST 60* 39 153* 53*  ALT 41 33 63* 49*  ALKPHOS 513* 397* 528* 450*  BILITOT 1.7* 1.3* 0.9 1.0  PROT 7.4 5.8* 6.8 6.4*  ALBUMIN 3.3* 2.8* 3.0* 2.8*   No results for input(s): LIPASE, AMYLASE in the last 168 hours. Recent Labs  Lab 12/16/17 0211  AMMONIA 27   Coagulation Profile: Recent Labs  Lab 12/18/17 0213  INR 1.07   Cardiac Enzymes: No results for input(s): CKTOTAL, CKMB, CKMBINDEX, TROPONINI in the last 168 hours. BNP (last 3 results) No results for input(s): PROBNP in the last 8760 hours. HbA1C: No results for input(s): HGBA1C in the last 72 hours. CBG: No results for input(s): GLUCAP in the last 168  hours. Lipid Profile: No results for input(s): CHOL, HDL, LDLCALC, TRIG, CHOLHDL, LDLDIRECT in the last 72 hours. Thyroid Function Tests: Recent Labs    12/16/17 0211  TSH 1.234   Anemia Panel: Recent Labs    12/16/17 0211 12/17/17 1321  VITAMINB12 415  --   FERRITIN  --  534*  TIBC  --  253  IRON  --  41   Sepsis Labs: Recent Labs  Lab 12/15/17 2100 12/16/17 0058 12/16/17 0211 12/17/17 0028  PROCALCITON  --   --  0.27 0.19  LATICACIDVEN 1.68 1.43  --   --     Recent Results (from the past 240 hour(s))  Blood culture (routine x 2)     Status: None (Preliminary result)   Collection Time: 12/16/17 12:52 AM  Result Value Ref Range Status   Specimen Description BLOOD LEFT WRIST  Final   Special Requests   Final  BOTTLES DRAWN AEROBIC AND ANAEROBIC Blood Culture results may not be optimal due to an inadequate volume of blood received in culture bottles   Culture   Final    NO GROWTH 1 DAY Performed at [facility] [address] [place], [place] [address]    Report Status PENDING  Incomplete  Blood culture (routine x 2)     Status: None (Preliminary result)   Collection Time: 12/16/17 12:53 AM  Result Value Ref Range Status   Specimen Description BLOOD LEFT HAND  Final   Special Requests   Final    BOTTLES DRAWN AEROBIC AND ANAEROBIC Blood Culture results may not be optimal due to an inadequate volume of blood received in culture bottles   Culture   Final    NO GROWTH 1 DAY Performed at [facility], [place] [address] [place], [place] [address]    Report Status PENDING  Incomplete         Radiology Studies: Dg Chest 2 View  Result Date: 12/18/2017 CLINICAL DATA:  Shortness of breath, weakness, pneumonia. EXAM: CHEST - 2 VIEW COMPARISON:  Chest x-ray dated 12/15/2017. FINDINGS: Heart size and mediastinal contours are within normal limits. Strandy opacities are again seen at the lung bases, LEFT greater than RIGHT, not significantly changed in the  interval. Questionable irregular nodular density just above the level of the minor fissure, seen on the lateral view only. No pneumothorax seen. No acute or suspicious osseous finding. IMPRESSION: 1. Irregular nodular density just above the level of the minor fissure, seen on the lateral view only, possible neoplastic pulmonary nodule. Recommend chest CT for further characterization. 2. Stable strandy opacities at each lung base. Findings could represent atelectasis, pneumonia or chronic scarring/fibrosis. These results will be called to the ordering clinician or representative by the Radiologist Assistant, and communication documented in the PACS or zVision Dashboard. Electronically Signed   By: Franki Cabot M.D.   On: 12/18/2017 09:32        Scheduled Meds: . apixaban  5 mg Oral BID  . azithromycin  500 mg Oral QHS  . calcium-vitamin D  1 tablet Oral Q breakfast  . feeding supplement (ENSURE ENLIVE)  237 mL Oral BID BM  . metoprolol tartrate  25 mg Oral BID  . potassium chloride  20 mEq Oral Once  . potassium chloride  40 mEq Oral Once  . sodium chloride flush  3 mL Intravenous Q12H   Continuous Infusions: . piperacillin-tazobactam (ZOSYN)  IV 3.375 g (12/18/17 0953)     LOS: 2 days    Time spent: 35 minutes.     Elmarie Shiley, MD [facility] Pager [phone]  If 7PM-7AM, please contact night-coverage [email or web address] Password [facility] 12/18/2017, 9:56 AM

## 2017-12-18 NOTE — Care Management Note (Addendum)
Case Management Note  Patient Details  Name: Kayla Booth MRN: [id number] Date of Birth: 1941/05/28  Subjective/Objective:    From home, new afib , benefit check in process for eliquis /xarelto.   Patient will be on Eliquis, NCM gave patient the eliquis 30 day savings coupon and informed her about her co pay of 45.00. Await pt eval.              Action/Plan: DC home when ready.   Expected Discharge Date:                  Expected Discharge Plan:  Home/Self Care  In-House Referral:     Discharge planning Services  CM Consult  Post Acute Care Choice:    Choice offered to:     DME Arranged:    DME Agency:     HH Arranged:    HH Agency:     Status of Service:  In process, will continue to follow  If discussed at Long Length of Stay Meetings, dates discussed:    Additional Comments:  Zenon Mayo, RN 12/18/2017, 11:01 AM

## 2017-12-18 NOTE — Progress Notes (Signed)
LFTs downtrending.  Serologies pending.  We can follow-up with patient as outpatient, patient's elevated LFTs shouldn't otherwise delay/impact patient's date of hospital discharge.

## 2017-12-18 NOTE — Progress Notes (Signed)
[facility] for Apixaban Indication: atrial fibrillation  Allergies  Allergen Reactions  . Aspirin Rash   Patient Measurements: Height: 5\' 2"  (157.5 cm) Weight: 144 lb 13.5 oz (65.7 kg) IBW/kg (Calculated) : 50.1  Vital Signs: Temp: 97.6 F (36.4 C) (07/22 0720) Temp Source: Oral (07/22 0720) BP: 130/66 (07/22 0720) Pulse Rate: 100 (07/22 0720)  Labs: Recent Labs    12/16/17 0212  12/16/17 1556 12/16/17 1603 12/17/17 0028 12/17/17 0818 12/18/17 0213  HGB  --   --  13.3  --  12.8  --  12.8  HCT  --   --  38.2  --  37.4  --  37.7  PLT  --   --  699*  --  681*  --  779*  LABPROT  --   --   --   --   --   --  13.8  INR  --   --   --   --   --   --  1.07  HEPARINUNFRC  --    < >  --  0.18* 0.35 0.40  --   CREATININE 0.69  --   --   --  0.63  --  0.84   < > = values in this interval not displayed.    Estimated Creatinine Clearance: 51.4 mL/min (by C-G formula based on SCr of 0.84 mg/dL).  Assessment: 77 y/o F who came to the ED with cold-like symptoms, EKG reveals new onset atrial fibrillation (no prior hx per patient). Pharmacy consulted to manage IV heparin that is now being transitioned to apixaban.  Age <80, SCr <1.5, Weight >60 kg so she does not require apixaban dose adjustment.  Goal of Therapy:  Heparin level 0.3-0.7 units/ml Monitor platelets by anticoagulation protocol: Yes   Plan:  Stop heparin Apixaban 5mg  PO BID Monitor CBC q72h Monitor for signs and symptoms of bleeding  Legrand Como, Pharm.D., BCPS, BCIDP Clinical Pharmacist Phone: [phone] Please check AMION for all [place] numbers 12/18/2017, 9:07 AM

## 2017-12-19 LAB — COMPREHENSIVE METABOLIC PANEL
ALT: 41 U/L (ref 0–44)
AST: 41 U/L (ref 15–41)
Albumin: 2.9 g/dL — ABNORMAL LOW (ref 3.5–5.0)
Alkaline Phosphatase: 399 U/L — ABNORMAL HIGH (ref 38–126)
Anion gap: 10 (ref 5–15)
BUN: <5 mg/dL — ABNORMAL LOW (ref 8–23)
CO2: 26 mmol/L (ref 22–32)
Calcium: 8.7 mg/dL — ABNORMAL LOW (ref 8.9–10.3)
Chloride: 105 mmol/L (ref 98–111)
Creatinine, Ser: 0.71 mg/dL (ref 0.44–1.00)
GFR calc Af Amer: >60 mL/min (ref >60)
GFR calc non Af Amer: >60 mL/min (ref >60)
Glucose, Bld: 100 mg/dL — ABNORMAL HIGH (ref 70–99)
Potassium: 4 mmol/L (ref 3.5–5.1)
Sodium: 141 mmol/L (ref 135–145)
Total Bilirubin: 0.9 mg/dL (ref 0.3–1.2)
Total Protein: 6.7 g/dL (ref 6.5–8.1)

## 2017-12-19 LAB — CBC
HCT: 37.8 % (ref 36.0–46.0)
Hemoglobin: 13 g/dL (ref 12.0–15.0)
MCH: 33.1 pg (ref 26.0–34.0)
MCHC: 34.4 g/dL (ref 30.0–36.0)
MCV: 96.2 fL (ref 78.0–100.0)
Platelets: 810 10*3/uL — ABNORMAL HIGH (ref 150–400)
RBC: 3.93 MIL/uL (ref 3.87–5.11)
RDW: 11.5 % (ref 11.5–15.5)
WBC: 11.4 10*3/uL — ABNORMAL HIGH (ref 4.0–10.5)

## 2017-12-19 LAB — ANTINUCLEAR ANTIBODIES, IFA: ANA Ab, IFA: NEGATIVE

## 2017-12-19 LAB — MITOCHONDRIAL ANTIBODIES: Mitochondrial M2 Ab, IgG: <20 Units (ref 0.0–20.0)

## 2017-12-19 LAB — GLIADIN ANTIBODIES, SERUM
Antigliadin Abs, IgA: 4 units (ref 0–19)
Gliadin IgG: 2 units (ref 0–19)

## 2017-12-19 LAB — TISSUE TRANSGLUTAMINASE, IGA: Tissue Transglutaminase Ab, IgA: <2 U/mL (ref 0–3)

## 2017-12-19 LAB — ANTI-SMOOTH MUSCLE ANTIBODY, IGG: F-Actin IgG: 6 Units (ref 0–19)

## 2017-12-19 MED ORDER — GUAIFENESIN ER 600 MG PO TB12: 600.0000 mg | ORAL_TABLET | Freq: Two times a day (BID) | ORAL | 0 refills | Status: DC

## 2017-12-19 MED ORDER — ENSURE ENLIVE PO LIQD
237.0000 mL | Freq: Two times a day (BID) | ORAL | 12 refills | Status: DC
Start: 2017-12-19 — End: 2018-01-15

## 2017-12-19 MED ORDER — HYDROCORTISONE 0.5 % EX CREA
TOPICAL_CREAM | Freq: Two times a day (BID) | CUTANEOUS | Status: DC
  Filled 2017-12-19: qty 28.35

## 2017-12-19 MED ORDER — APIXABAN 5 MG PO TABS: 5.0000 mg | ORAL_TABLET | Freq: Two times a day (BID) | ORAL | 1 refills | Status: DC

## 2017-12-19 MED ORDER — METOPROLOL TARTRATE 25 MG PO TABS: 25.0000 mg | ORAL_TABLET | Freq: Two times a day (BID) | ORAL | 0 refills | Status: DC

## 2017-12-19 NOTE — Discharge Instructions (Signed)
Follow up with cardiology for further evaluation and treatment of Atrial fibrillation.   Follow up with Dr Paulita Fujita for further evaluation of abnormal liver function test. You have multiples labs test results pending.   You will need a follow up CT chest in 3 month  Follow up with your PCP for result of sputum culture.

## 2017-12-19 NOTE — Care Management Important Message (Signed)
Important Message  Patient Details  Name: Kayla Booth MRN: [id number] Date of Birth: [birth year]   Medicare Important Message Given:  Yes    Orbie Pyo 12/19/2017, 2:11 PM

## 2017-12-19 NOTE — Discharge Summary (Signed)
Physician Discharge Summary  LATHA STAUNTON SL[facility]:[id number] DOB: 1941-04-05 DOA: 12/15/2017  PCP: Helane Rima, MD  Admit date: 12/15/2017 Discharge date: 12/19/2017  Admitted From: Home  Disposition:  Home   Recommendations for Outpatient Follow-up:  1. Follow up with PCP in 1-2 weeks 2. Please obtain BMP/CBC in one week 3. Needs to follow up with cardiology fro further care of A fib.  4. Follow up with  GI, for further test results pending for work up of transaminases.  5. Follow sputum AFB and need repeat CT chest in 3 month,.  6. Needs to follow up with ID depending on AFB sputum results   Home Health: none  Discharge Condition: stable.  CODE STATUS; full code.  Diet recommendation: Heart Healthy  Brief/Interim Summary:  Brief Narrative: Kayla Booth a 77 y.o.femalewith medical history significant forhypertensionand hereditary spherocytosis status-post splenectomy, now presenting to the emergency department for evaluation of productive cough, shortness of breath, hallucinations, and confusion. Patient reports that she developed a cough approximately 2 weeks ago that has been productive of thick green sputum. She has become progressively dyspneic since that time and developed some confusion with visual hallucinations over the past 2 days. She denies chest pain, palpitations, abdominal pain, vomiting, or diarrhea. She denies any use of alcohol or illicit substances and there has not been any new medications. She denies any recent fall or trauma. She has never experienced these symptoms previously. She denies any history of GI bleed, hemorrhage, or easy bruising.  ED Course:Upon arrival to the ED, patient is found to have a temp of 37.7 C,saturating low 90s on room air, slightly tachypneic, slightly tachycardic, and with stable blood pressure. EKG features atrial fibrillation, apparently new. Chemistry panel is notable for an alkaline phosphatase of 513 and total  bilirubin 1.7. CBC is notable for leukocytosis to 15,900 and thrombocytosis with platelets 753,000. Lactic acid is reassuring at 1.68. Blood cultures were collected, 1 L of normal saline given, and the patient was started on Rocephin and azithromycin. She remains hemodynamically stable and will be admitted for ongoing evaluation and management of community-acquired pneumonia with new atrial fibrillation and acute encephalopathy.     Assessment & Plan:   Principal Problem:   CAP (community acquired pneumonia) Active Problems:   HTN (hypertension)   New onset atrial fibrillation (HCC)   Acute encephalopathy   Abnormal LFTs  CAP;  -She is found to have a leukocytosis, low-grade temp, and CXR concerning for PNA -strep pneumonia negative.  -Treated  with azithromycin. Ceftriaxone change to zosyn 7-21 to avoid liver toxicity. Received 4 days of IV antibiotics. No further antibiotics needed per ID>  -Blood culture no growth to date.  -Legionella antigen negative -Repeated chest x ray; with pulmonary nodule. CT chest order  now showed multifocal PNA> also concern for MAI. Evaluated by ID, plan to get sputum AFB, follow up out patient. She will need repeat CT chest also in 3 months.  ID consulted due to persistent leukocytosis in asplenic patient.   New A fib;  Transition to eliquis.  Continue with metoprolol/  ECHO EF; Atrial fibrillation throughout study. Normal LV function. Mild AR, trivial MR/TR. Cardiology consulted. Appreciate Dr Lovena Le evaluation  Hypokalemia;  Resolved.   Acute encephalopathy; metabolic Presents with hallucinations, confusion x2 days TSH 1.2, B 12; 415, ammonia 27,  CT head unremarkable.  Resolved    Elevated alkaline phosphatase and bilirubin -Alkaline phosphatase elevated to 513 and total bilirubin 1.7 on admission -[place] negative. -LFT trending down.  -  GI consulted.  -Hepatitis panel p. negative Antis smooth , anti mitochondrial,  ANA, ceruloplasmin, IgG, IgA, Reticullin antibody, anti gliandin  pending.  HTN; stop Norvasc, started on metoprolol.      Discharge Diagnoses:  Principal Problem:   CAP (community acquired pneumonia) Active Problems:   HTN (hypertension)   New onset atrial fibrillation (HCC)   Acute encephalopathy   Abnormal LFTs    Discharge Instructions  Discharge Instructions    Diet - low sodium heart healthy   Complete by:  As directed    Increase activity slowly   Complete by:  As directed      Allergies as of 12/19/2017      Reactions   Aspirin Rash      Medication List    STOP taking these medications   amLODipine 5 MG tablet Commonly known as:  NORVASC     TAKE these medications   acetaminophen 500 MG tablet Commonly known as:  TYLENOL Take 500 mg by mouth every 6 (six) hours as needed for pain.   apixaban 5 MG Tabs tablet Commonly known as:  ELIQUIS Take 1 tablet (5 mg total) by mouth 2 (two) times daily.   calcium citrate-vitamin D 500-400 MG-UNIT chewable tablet Chew 1 tablet by mouth daily.   cholecalciferol 400 units Tabs tablet Commonly known as:  VITAMIN D Take 400 Units by mouth daily.   feeding supplement (ENSURE ENLIVE) Liqd Take 237 mLs by mouth 2 (two) times daily between meals.   guaiFENesin 600 MG 12 hr tablet Commonly known as:  MUCINEX Take 1 tablet (600 mg total) by mouth 2 (two) times daily.   metoprolol tartrate 25 MG tablet Commonly known as:  LOPRESSOR Take 1 tablet (25 mg total) by mouth 2 (two) times daily.   VITAMIN B-12 PO Take 1 tablet by mouth daily.      Follow-up Information    Jerline Pain, MD Follow up on 01/15/2018.   Specialty:  Cardiology Why:  at 9:00 AM  Contact information: [address] [place] [address] [phone]        Arta Silence, MD Follow up in 1 week(s).   Specialty:  Gastroenterology Why:  follow up with Dr Paulita Fujita for further evaluation of increase liver enzymes,  and result of lab work.  Contact information: [address] [place] [address] [phone]        Helane Rima, MD Follow up in 1 week(s).   Specialty:  Family Medicine Contact information: [place] [address] [phone]          Allergies  Allergen Reactions  . Aspirin Rash    Consultations:  GI  Cardiology  ID   Procedures/Studies: Dg Chest 2 View  Result Date: 12/18/2017 CLINICAL DATA:  Shortness of breath, weakness, pneumonia. EXAM: CHEST - 2 VIEW COMPARISON:  Chest x-ray dated 12/15/2017. FINDINGS: Heart size and mediastinal contours are within normal limits. Strandy opacities are again seen at the lung bases, LEFT greater than RIGHT, not significantly changed in the interval. Questionable irregular nodular density just above the level of the minor fissure, seen on the lateral view only. No pneumothorax seen. No acute or suspicious osseous finding. IMPRESSION: 1. Irregular nodular density just above the level of the minor fissure, seen on the lateral view only, possible neoplastic pulmonary nodule. Recommend chest CT for further characterization. 2. Stable strandy opacities at each lung base. Findings could represent atelectasis, pneumonia or chronic  scarring/fibrosis. These results will be called to the ordering clinician or representative by the Radiologist Assistant, and communication documented in the PACS or zVision Dashboard. Electronically Signed   By: Franki Cabot M.D.   On: 12/18/2017 09:32   Dg Chest 2 View  Result Date: 12/15/2017 CLINICAL DATA:  Cough and congestion for 2 weeks. EXAM: CHEST - 2 VIEW COMPARISON:  None. FINDINGS: Cardiomediastinal silhouette is normal. Mild interstitial prominence. Lingular patchy airspace opacity and LEFT lung base strandy densities. Small pleural effusions. No pneumothorax. Soft tissue planes and included osseous structures are non suspicious. Osteopenia.  IMPRESSION: Lingular atelectasis versus pneumonia.  Small pleural effusions. Mild interstitial prominence with LEFT lung base atelectasis/scarring. Electronically Signed   By: Elon Alas M.D.   On: 12/15/2017 20:54   Ct Head Wo Contrast  Result Date: 12/16/2017 CLINICAL DATA:  77 year old female with altered mental status. EXAM: CT HEAD WITHOUT CONTRAST TECHNIQUE: Contiguous axial images were obtained from the base of the skull through the vertex without intravenous contrast. COMPARISON:  None. FINDINGS: Brain: The ventricles and sulci appropriate size for patient's age. The gray-white matter discrimination is preserved. There is no acute intracranial hemorrhage. No mass effect or midline shift. No extra-axial fluid collection. Vascular: No hyperdense vessel or unexpected calcification. Skull: Normal. Negative for fracture or focal lesion. Sinuses/Orbits: No acute finding. Other: None IMPRESSION: Unremarkable noncontrast CT of the brain for age. Electronically Signed   By: Anner Crete M.D.   On: 12/16/2017 01:44   Ct Chest Wo Contrast  Result Date: 12/18/2017 CLINICAL DATA:  Lung nodule.  Pneumonia. EXAM: CT CHEST WITHOUT CONTRAST TECHNIQUE: Multidetector CT imaging of the chest was performed following the standard protocol without IV contrast. COMPARISON:  None. FINDINGS: Cardiovascular: Cardiomegaly. Scattered aortic and coronary artery calcifications. No aneurysm. Mediastinum/Nodes: No mediastinal, hilar, or axillary adenopathy. Lungs/Pleura: Biapical scarring. Airspace disease noted within the anterior right middle lobe and lingula corresponding to the density noted on prior chest x-ray. In addition, there are clustered nodular densities in the right lower lobe with areas of more confluent consolidation noted in the anterior right upper lobe, medial posterior right upper lobe, and left lower lobe. Trace right pleural effusion. Upper Abdomen: Imaging into the upper abdomen shows no acute  findings. Musculoskeletal: Chest wall soft tissues are unremarkable. No acute bony abnormality. IMPRESSION: Peripheral, somewhat nodular airspace disease scattered throughout the lungs bilaterally. Density on the prior chest x-ray represents anterior pleural based airspace disease in the right middle lobe and lingula. Findings most compatible with infectious process, possibly multifocal pneumonia or indolent infection such as MAI. This could be followed with repeat chest CT in 3 months after treatment to ensure stability or regression of disease. Trace right pleural effusion. Cardiomegaly. Scattered coronary artery and aortic calcifications. Electronically Signed   By: Rolm Baptise M.D.   On: 12/18/2017 12:47   US Abdomen Limited Ruq  Result Date: 12/16/2017 CLINICAL DATA:  Blood ALP increased. Previous splenectomy. Hereditary spherocytosis. EXAM: ULTRASOUND ABDOMEN LIMITED RIGHT UPPER QUADRANT COMPARISON:  None. FINDINGS: Gallbladder: No gallstones or wall thickening visualized. No sonographic Murphy sign noted by sonographer. Common bile duct: Diameter: 4.4 mm. Liver: No focal lesion identified. Within normal limits in parenchymal echogenicity. Portal vein is patent on color Doppler imaging with normal direction of blood flow towards the liver. Tiny right pleural effusion. IMPRESSION: No acute findings. Electronically Signed   By: Marin Olp M.D.   On: 12/16/2017 10:08    Subjective: She is feeling better, stronger, cough improved  Discharge Exam: Vitals:   12/18/17 2343 12/19/17 0816  BP: (!) 107/54 116/63  Pulse: 78 (!) 106  Resp: 12 18  Temp: (!) 97.5 F (36.4 C) 98.5 F (36.9 C)  SpO2: 95% 94%   Vitals:   12/18/17 2123 12/18/17 2343 12/19/17 0539 12/19/17 0816  BP: (!) 122/55 (!) 107/54  116/63  Pulse: 96 78  (!) 106  Resp: (!) 22 12  18   Temp: 97.9 F (36.6 C) (!) 97.5 F (36.4 C)  98.5 F (36.9 C)  TempSrc: Oral Oral  Oral  SpO2: 96% 95%  94%  Weight:  65.2 kg (143 lb 11.8  oz) 65.7 kg (144 lb 13.5 oz)   Height:        General: Pt is alert, awake, not in acute distress Cardiovascular: RRR, S1/S2 +, no rubs, no gallops Respiratory: CTA bilaterally, no wheezing, no rhonchi Abdominal: Soft, NT, ND, bowel sounds + Extremities: no edema, no cyanosis    The results of significant diagnostics from this hospitalization (including imaging, microbiology, ancillary and laboratory) are listed below for reference.     Microbiology: Recent Results (from the past 240 hour(s))  Blood culture (routine x 2)     Status: None (Preliminary result)   Collection Time: 12/16/17 12:52 AM  Result Value Ref Range Status   Specimen Description BLOOD LEFT WRIST  Final   Special Requests   Final    BOTTLES DRAWN AEROBIC AND ANAEROBIC Blood Culture results may not be optimal due to an inadequate volume of blood received in culture bottles   Culture   Final    NO GROWTH 2 DAYS Performed at [facility], [place] [address] [place], [place] [address]    Report Status PENDING  Incomplete  Blood culture (routine x 2)     Status: None (Preliminary result)   Collection Time: 12/16/17 12:53 AM  Result Value Ref Range Status   Specimen Description BLOOD LEFT HAND  Final   Special Requests   Final    BOTTLES DRAWN AEROBIC AND ANAEROBIC Blood Culture results may not be optimal due to an inadequate volume of blood received in culture bottles   Culture   Final    NO GROWTH 2 DAYS Performed at [facility], [place] [address], [place], [place] [address]    Report Status PENDING  Incomplete     Labs: BNP (last 3 results) No results for input(s): BNP in the last 8760 hours. Basic Metabolic Panel: Recent Labs  Lab 12/15/17 2025 12/16/17 0212 12/17/17 0028 12/18/17 0213 12/19/17 0757  NA 136 138 138 142 141  K 3.5 3.3* 4.2 3.8 4.0  CL 99 106 107 102 105  CO2 25 21* 21* 29 26  GLUCOSE 107* 106* 124* 107* 100*  BUN 7* 6* 6* <5* <5*  CREATININE 0.88 0.69 0.63 0.84 0.71   CALCIUM 8.8* 7.5* 8.5* 8.7* 8.7*  MG  --  1.7  --   --   --    Liver Function Tests: Recent Labs  Lab 12/15/17 2025 12/16/17 0212 12/17/17 0728 12/18/17 0213 12/19/17 0757  AST 60* 39 153* 53* 41  ALT 41 33 63* 49* 41  ALKPHOS 513* 397* 528* 450* 399*  BILITOT 1.7* 1.3* 0.9 1.0 0.9  PROT 7.4 5.8* 6.8 6.4* 6.7  ALBUMIN 3.3* 2.8* 3.0* 2.8* 2.9*   No results for input(s): LIPASE, AMYLASE in the last 168 hours. Recent Labs  Lab 12/16/17 0211  AMMONIA 27   CBC: Recent Labs  Lab 12/15/17 2025 12/16/17 0211 12/16/17 1556 12/17/17 0028 12/18/17 0213 12/19/17 0757  WBC 15.9*  --  15.7* 15.8* 16.1* 11.4*  NEUTROABS 11.5*  --   --   --  11.2*  --   HGB 13.7  --  13.3 12.8 12.8 13.0  HCT 38.8 34.3 38.2 37.4 37.7 37.8  MCV 95.1  --  96.0 97.4 97.7 96.2  PLT 753*  --  699* 681* 779* 810*   Cardiac Enzymes: No results for input(s): CKTOTAL, CKMB, CKMBINDEX, TROPONINI in the last 168 hours. BNP: Invalid input(s): POCBNP CBG: No results for input(s): GLUCAP in the last 168 hours. D-Dimer No results for input(s): DDIMER in the last 72 hours. Hgb A1c No results for input(s): HGBA1C in the last 72 hours. Lipid Profile No results for input(s): CHOL, HDL, LDLCALC, TRIG, CHOLHDL, LDLDIRECT in the last 72 hours. Thyroid function studies No results for input(s): TSH, T4TOTAL, T3FREE, THYROIDAB in the last 72 hours.  Invalid input(s): FREET3 Anemia work up Recent Labs    12/17/17 1321  FERRITIN 534*  TIBC 253  IRON 41   Urinalysis    Component Value Date/Time   COLORURINE AMBER (A) 12/15/2017 2023   APPEARANCEUR CLOUDY (A) 12/15/2017 2023   LABSPEC 1.025 12/15/2017 2023   PHURINE 5.0 12/15/2017 2023   GLUCOSEU 100 (A) 12/15/2017 2023   HGBUR NEGATIVE 12/15/2017 2023   BILIRUBINUR MODERATE (A) 12/15/2017 2023   KETONESUR 15 (A) 12/15/2017 2023   PROTEINUR NEGATIVE 12/15/2017 2023   NITRITE NEGATIVE 12/15/2017 2023   LEUKOCYTESUR NEGATIVE 12/15/2017 2023   Sepsis  Labs Invalid input(s): PROCALCITONIN,  WBC,  LACTICIDVEN Microbiology Recent Results (from the past 240 hour(s))  Blood culture (routine x 2)     Status: None (Preliminary result)   Collection Time: 12/16/17 12:52 AM  Result Value Ref Range Status   Specimen Description BLOOD LEFT WRIST  Final   Special Requests   Final    BOTTLES DRAWN AEROBIC AND ANAEROBIC Blood Culture results may not be optimal due to an inadequate volume of blood received in culture bottles   Culture   Final    NO GROWTH 2 DAYS Performed at [facility], [place] [address] [place], [place] [address]    Report Status PENDING  Incomplete  Blood culture (routine x 2)     Status: None (Preliminary result)   Collection Time: 12/16/17 12:53 AM  Result Value Ref Range Status   Specimen Description BLOOD LEFT HAND  Final   Special Requests   Final    BOTTLES DRAWN AEROBIC AND ANAEROBIC Blood Culture results may not be optimal due to an inadequate volume of blood received in culture bottles   Culture   Final    NO GROWTH 2 DAYS Performed at [facility], [place] [address] [place], [place] [address]    Report Status PENDING  Incomplete     Time coordinating discharge: 35 minutes.   SIGNED:   Elmarie Shiley, MD  [facility] 12/19/2017, 1:23 PM Pager   If 7PM-7AM, please contact night-coverage [email or web address] Password [email or web address]

## 2017-12-19 NOTE — Progress Notes (Signed)
LFTs downtrending.  Patient tells me she's going home today.  I will follow-up with her in a few weeks; most of her serologies are pending.  Thanks for the consult.

## 2017-12-19 NOTE — Plan of Care (Signed)
Discussed plan of care with patient.  Emphasized the importance of using the call bell when assistance is needed.  No teach back displayed.

## 2017-12-19 NOTE — Evaluation (Signed)
Physical Therapy Evaluation Patient Details Name: Kayla Booth MRN: [id number] DOB: 1941-04-08 Today's Date: 12/19/2017   History of Present Illness  77 y.o. female with medical history significant for hypertension and hereditary spherocytosis status-post splenectomy, now presenting to the emergency department for evaluation of productive cough, shortness of breath, hallucinations, and confusion. Admitted with community-acquired pneumonia with new atrial fibrillation and acute encephalopathy.  Clinical Impression  PTA pt independent in community ambulation without AD, driving, cooking, cleaning and self-care. Pt currently has mild instability that improved with ambulation. Pt mod I for bed mobility and transfers and ambulated 500 feet without AD under supervision. Pt scored 20/24 on DGI placing her at decreased risk for falls. PT has no recommendation for further PT after d/c, however PT will continue to follow acutely to work on higher level balance activities.     Follow Up Recommendations No PT follow up;Supervision - Intermittent    Equipment Recommendations  None recommended by PT    Recommendations for Other Services       Precautions / Restrictions Precautions Precautions: Fall Restrictions Weight Bearing Restrictions: No      Mobility  Bed Mobility Overal bed mobility: Modified Independent                Transfers Overall transfer level: Modified independent Equipment used: None             General transfer comment: good power up and steadying in standing  Ambulation/Gait Ambulation/Gait assistance: Supervision Gait Distance (Feet): 500 Feet Assistive device: None Gait Pattern/deviations: Decreased stride length;Step-through pattern Gait velocity: slowed Gait velocity interpretation: 1.31 - 2.62 ft/sec, indicative of limited community ambulator General Gait Details: supervision for safety, initial mild instability with no overt LoB, reached for the  sink for steadying as she walked by, once out in hall pt stability improved and no need for UE support   Stairs Stairs: Yes Stairs assistance: Supervision Stair Management: One rail Left;Alternating pattern Number of Stairs: 3 General stair comments: steady ascent/descent with use of rail       Balance  Standardized Balance Assessment Standardized Balance Assessment : Dynamic Gait Index   Dynamic Gait Index Level Surface: Normal Change in Gait Speed: Mild Impairment Gait with Horizontal Head Turns: Normal Gait with Vertical Head Turns: Normal Gait and Pivot Turn: Normal Step Over Obstacle: Mild Impairment Step Around Obstacles: Mild Impairment Steps: Mild Impairment Total Score: 20       Pertinent Vitals/Pain Pain Assessment: No/denies pain    Home Living Family/patient expects to be discharged to:: Private residence Living Arrangements: Children Available Help at Discharge: Available 24 hours/day Type of Home: House Home Access: Stairs to enter   Technical brewer of Steps: 2 Home Layout: One level Home Equipment: Cane - single point;Grab bars - tub/shower      Prior Function Level of Independence: Independent               Hand Dominance   Dominant Hand: Right    Extremity/Trunk Assessment   Upper Extremity Assessment Upper Extremity Assessment: Overall WFL for tasks assessed    Lower Extremity Assessment Lower Extremity Assessment: RLE deficits/detail;LLE deficits/detail RLE Deficits / Details: ROM WFL, Strength grossly assessed 4/5 RLE Sensation: WNL RLE Coordination: WNL LLE Deficits / Details: ROM WFL, Strength grossly assessed 4/5 LLE Sensation: WNL LLE Coordination: WNL       Communication   Communication: No difficulties  Cognition Arousal/Alertness: Awake/alert Behavior During Therapy: WFL for tasks assessed/performed Overall Cognitive Status: No family/caregiver present to  determine baseline cognitive functioning                                         General Comments General comments (skin integrity, edema, etc.): VSS    Exercises     Assessment/Plan    PT Assessment Patient needs continued PT services  PT Problem List Decreased balance       PT Treatment Interventions Gait training;Stair training;Functional mobility training;Therapeutic activities;Therapeutic exercise;Balance training;Patient/family education    PT Goals (Current goals can be found in the Care Plan section)  Acute Rehab PT Goals Patient Stated Goal: go home today PT Goal Formulation: With patient Time For Goal Achievement: 01/02/18 Potential to Achieve Goals: Good    Frequency Min 3X/week    AM-PAC PT "6 Clicks" Daily Activity  Outcome Measure Difficulty turning over in bed (including adjusting bedclothes, sheets and blankets)?: None Difficulty moving from lying on back to sitting on the side of the bed? : A Little Difficulty sitting down on and standing up from a chair with arms (e.g., wheelchair, bedside commode, etc,.)?: A Little Help needed moving to and from a bed to chair (including a wheelchair)?: None Help needed walking in hospital room?: A Little Help needed climbing 3-5 steps with a railing? : A Little 6 Click Score: 20    End of Session Equipment Utilized During Treatment: Gait belt Activity Tolerance: Patient tolerated treatment well Patient left: in chair;with call bell/phone within reach;with chair alarm set Nurse Communication: Mobility status PT Visit Diagnosis: Unsteadiness on feet (R26.81);Other abnormalities of gait and mobility (R26.89)    Time: 2122-4825 PT Time Calculation (min) (ACUTE ONLY): 22 min   Charges:   PT Evaluation $PT Eval Moderate Complexity: 1 Mod     PT G Codes:        Valkyrie Guardiola B. Migdalia Dk PT, DPT Acute Rehabilitation  [phone] Pager [phone]    [place] 12/19/2017, 10:29 AM

## 2017-12-20 LAB — ACID FAST SMEAR (AFB, MYCOBACTERIA): Acid Fast Smear: NEGATIVE

## 2017-12-20 LAB — RETICULIN ANTIBODIES, IGA W TITER: Reticulin Ab, IgA: NEGATIVE titer (ref <2.5)

## 2017-12-21 LAB — CULTURE, BLOOD (ROUTINE X 2)
Culture: NO GROWTH
Culture: NO GROWTH

## 2017-12-26 DIAGNOSIS — R918 Other nonspecific abnormal finding of lung field: Secondary | ICD-10-CM | POA: Insufficient documentation

## 2017-12-29 ENCOUNTER — Telehealth: Payer: Self-pay | Admitting: Hematology

## 2017-12-29 ENCOUNTER — Encounter: Payer: Self-pay | Admitting: Hematology

## 2017-12-29 NOTE — Telephone Encounter (Signed)
New referral received from Dr. Lavone Neri for elevated wbc. Pt has been cld and scheduled to see Dr. Irene Limbo on 8/8 at 11am. Pt agreed to the appt date and time. Aware to arrive 30 minutes early. Letter mailed.

## 2018-01-03 NOTE — Progress Notes (Signed)
HEMATOLOGY/ONCOLOGY CONSULTATION NOTE  Date of Service: 01/04/2018  Patient Care Team: Helane Rima, MD as PCP - General (Family Medicine) Jerline Pain, MD as PCP - Cardiology (Cardiology)  CHIEF COMPLAINTS/PURPOSE OF CONSULTATION:  Thrombocytosis  HISTORY OF PRESENTING ILLNESS:   Kayla Booth is a wonderful 77 y.o. female who has been referred to [place] by Dr. Helane Rima for evaluation and management of Thrombocytosis. The pt reports that she is doing well overall.   The pt notes that she has recovered well from her recent Pneumonia, being discharged on 12/19/17. She has finished her antibiotic course. She denies having frequent infections.   The pt notes that she has maintained follow up with her PCP with annual visits but has not been aware of her increased platelet counts until recently.  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 She is taking Eliquis for Afib.   The pt reports that she has never needed a blood transfusion, denies concerns for anemia, and has not taken folic acid despite a splenectomy.   She will be seeing Dr. Paulita Fujita in GI for her recently elevated Alkaline Phosphatase.   She notes that she takes Vitamin B12 and Vitamin D. She does not use a walker or a cane.   Most recent lab results (12/18/17) of CBC is as follows: all values are WNL except for WBC at 16.1k, RBC at 3.86, PLT at 779k., Immature grans abs at 200, ANC at 11.2k, Monocytes abs at 1.2k.   On review of systems, pt reports recently resolved Pneumonia, good energy levels, and denies leg swelling, CP, SOB, frequent infections, abdominal pains, changes in bowel habits, urinary discomfort or difficulty, vision changes, and any other symptoms.   On PMHx the pt reports hereditary spherocytosis with splenectomy at age 32, Osteoporosis, Afib.  MEDICAL HISTORY:  Past Medical History:  Diagnosis Date  . Abnormal LFTs 12/16/2017  . Acute  encephalopathy 12/16/2017  . CAP (community acquired pneumonia) 12/16/2017  . Hereditary spherocytosis ([place])   . HTN (hypertension)   . Hyperlipidemia   . New onset atrial fibrillation ([place]) 12/16/2017    SURGICAL HISTORY: Past Surgical History:  Procedure Laterality Date  . SPLENECTOMY      SOCIAL HISTORY: Social History   Socioeconomic History  . Marital status: Divorced    Spouse name: Not on file  . Number of children: Not on file  . Years of education: Not on file  . Highest education level: Not on file  Occupational History  . Not on file  Social Needs  . Financial resource strain: Not on file  . Food insecurity:    Worry: Not on file    Inability: Not on file  . Transportation needs:    Medical: Not on file    Non-medical: Not on file  Tobacco Use  . Smoking status: Never Smoker  . Smokeless tobacco: Never Used  Substance and Sexual Activity  . Alcohol use: No  . Drug use: No  . Sexual activity: Not on file  Lifestyle  . Physical activity:    Days per week: Not on file    Minutes per session: Not on file  . Stress: Not on file  Relationships  . Social connections:    Talks on phone: Not on file    Gets together: Not on file    Attends religious service: Not on file    Active member of club or organization: Not on file  Attends meetings of clubs or organizations: Not on file    Relationship status: Not on file  . Intimate partner violence:    Fear of current or ex partner: Not on file    Emotionally abused: Not on file    Physically abused: Not on file    Forced sexual activity: Not on file  Other Topics Concern  . Not on file  Social History Narrative  . Not on file    FAMILY HISTORY: Family History  Problem Relation Age of Onset  . Heart failure Unknown   . Diabetes Mother   . Stroke Mother     ALLERGIES:  is allergic to aspirin.  MEDICATIONS:  Current Outpatient Medications  Medication Sig Dispense Refill  . acetaminophen (TYLENOL)  500 MG tablet Take 500 mg by mouth every 6 (six) hours as needed for pain.    Marland Kitchen apixaban (ELIQUIS) 5 MG TABS tablet Take 1 tablet (5 mg total) by mouth 2 (two) times daily. 60 tablet 1  . calcium citrate-vitamin D 500-400 MG-UNIT chewable tablet Chew 1 tablet by mouth daily.    . cholecalciferol (VITAMIN D) 400 units TABS tablet Take 400 Units by mouth daily.    . Cyanocobalamin (VITAMIN B-12 PO) Take 1 tablet by mouth daily.    . feeding supplement, ENSURE ENLIVE, (ENSURE ENLIVE) LIQD Take 237 mLs by mouth 2 (two) times daily between meals. (Patient not taking: Reported on 01/04/2018) 237 mL 12  . guaiFENesin (MUCINEX) 600 MG 12 hr tablet Take 1 tablet (600 mg total) by mouth 2 (two) times daily. (Patient not taking: Reported on 01/04/2018) 20 tablet 0  . metoprolol tartrate (LOPRESSOR) 25 MG tablet Take 1 tablet (25 mg total) by mouth 2 (two) times daily. 60 tablet 0   No current facility-administered medications for this visit.     REVIEW OF SYSTEMS:    10 Point review of Systems was done is negative except as noted above.  PHYSICAL EXAMINATION:  . Vitals:   01/04/18 1200  BP: 134/79  Pulse: 90  Resp: 18  Temp: 97.6 F (36.4 C)  SpO2: 97%   Filed Weights   01/04/18 1200  Weight: 142 lb 6.4 oz (64.6 kg)   .Body mass index is 26.05 kg/m.  GENERAL:alert, in no acute distress and comfortable SKIN: no acute rashes, no significant lesions EYES: conjunctiva are pink and non-injected, sclera anicteric OROPHARYNX: MMM, no exudates, no oropharyngeal erythema or ulceration NECK: supple, no JVD LYMPH:  no palpable lymphadenopathy in the cervical, axillary or inguinal regions LUNGS: clear to auscultation b/l with normal respiratory effort HEART: regular rate & rhythm ABDOMEN:  normoactive bowel sounds , non tender, not distended. Extremity: no pedal edema PSYCH: alert & oriented x 3 with fluent speech NEURO: no focal motor/sensory deficits  LABORATORY DATA:  I have reviewed the  data as listed  . CBC Latest Ref Rng & Units 12/19/2017 12/18/2017 12/17/2017  WBC 4.0 - 10.5 K/uL 11.4(H) 16.1(H) 15.8(H)  Hemoglobin 12.0 - 15.0 g/dL 13.0 12.8 12.8  Hematocrit 36.0 - 46.0 % 37.8 37.7 37.4  Platelets 150 - 400 K/uL 810(H) 779(H) 681(H)    . CMP Latest Ref Rng & Units 12/19/2017 12/18/2017 12/17/2017  Glucose 70 - 99 mg/dL 100(H) 107(H) 124(H)  BUN 8 - 23 mg/dL <5(L) <5(L) 6(L)  Creatinine 0.44 - 1.00 mg/dL 0.71 0.84 0.63  Sodium 135 - 145 mmol/L 141 142 138  Potassium 3.5 - 5.1 mmol/L 4.0 3.8 4.2  Chloride 98 - 111 mmol/L 105  102 107  CO2 22 - 32 mmol/L 26 29 21(L)  Calcium 8.9 - 10.3 mg/dL 8.7(L) 8.7(L) 8.5(L)  Total Protein 6.5 - 8.1 g/dL 6.7 6.4(L) 6.8  Total Bilirubin 0.3 - 1.2 mg/dL 0.9 1.0 0.9  Alkaline Phos 38 - 126 U/L 399(H) 450(H) 528(H)  AST 15 - 41 U/L 41 53(H) 153(H)  ALT 0 - 44 U/L 41 49(H) 63(H)     RADIOGRAPHIC STUDIES: I have personally reviewed the radiological images as listed and agreed with the findings in the report. Dg Chest 2 View  Result Date: 12/18/2017 CLINICAL DATA:  Shortness of breath, weakness, pneumonia. EXAM: CHEST - 2 VIEW COMPARISON:  Chest x-ray dated 12/15/2017. FINDINGS: Heart size and mediastinal contours are within normal limits. Strandy opacities are again seen at the lung bases, LEFT greater than RIGHT, not significantly changed in the interval. Questionable irregular nodular density just above the level of the minor fissure, seen on the lateral view only. No pneumothorax seen. No acute or suspicious osseous finding. IMPRESSION: 1. Irregular nodular density just above the level of the minor fissure, seen on the lateral view only, possible neoplastic pulmonary nodule. Recommend chest CT for further characterization. 2. Stable strandy opacities at each lung base. Findings could represent atelectasis, pneumonia or chronic scarring/fibrosis. These results will be called to the ordering clinician or representative by the Radiologist  Assistant, and communication documented in the PACS or zVision Dashboard. Electronically Signed   By: Franki Cabot M.D.   On: 12/18/2017 09:32   Dg Chest 2 View  Result Date: 12/15/2017 CLINICAL DATA:  Cough and congestion for 2 weeks. EXAM: CHEST - 2 VIEW COMPARISON:  None. FINDINGS: Cardiomediastinal silhouette is normal. Mild interstitial prominence. Lingular patchy airspace opacity and LEFT lung base strandy densities. Small pleural effusions. No pneumothorax. Soft tissue planes and included osseous structures are non suspicious. Osteopenia. IMPRESSION: Lingular atelectasis versus pneumonia.  Small pleural effusions. Mild interstitial prominence with LEFT lung base atelectasis/scarring. Electronically Signed   By: Elon Alas M.D.   On: 12/15/2017 20:54   Ct Head Wo Contrast  Result Date: 12/16/2017 CLINICAL DATA:  77 year old female with altered mental status. EXAM: CT HEAD WITHOUT CONTRAST TECHNIQUE: Contiguous axial images were obtained from the base of the skull through the vertex without intravenous contrast. COMPARISON:  None. FINDINGS: Brain: The ventricles and sulci appropriate size for patient's age. The gray-white matter discrimination is preserved. There is no acute intracranial hemorrhage. No mass effect or midline shift. No extra-axial fluid collection. Vascular: No hyperdense vessel or unexpected calcification. Skull: Normal. Negative for fracture or focal lesion. Sinuses/Orbits: No acute finding. Other: None IMPRESSION: Unremarkable noncontrast CT of the brain for age. Electronically Signed   By: Anner Crete M.D.   On: 12/16/2017 01:44   Ct Chest Wo Contrast  Result Date: 12/18/2017 CLINICAL DATA:  Lung nodule.  Pneumonia. EXAM: CT CHEST WITHOUT CONTRAST TECHNIQUE: Multidetector CT imaging of the chest was performed following the standard protocol without IV contrast. COMPARISON:  None. FINDINGS: Cardiovascular: Cardiomegaly. Scattered aortic and coronary artery  calcifications. No aneurysm. Mediastinum/Nodes: No mediastinal, hilar, or axillary adenopathy. Lungs/Pleura: Biapical scarring. Airspace disease noted within the anterior right middle lobe and lingula corresponding to the density noted on prior chest x-ray. In addition, there are clustered nodular densities in the right lower lobe with areas of more confluent consolidation noted in the anterior right upper lobe, medial posterior right upper lobe, and left lower lobe. Trace right pleural effusion. Upper Abdomen: Imaging into the upper abdomen  shows no acute findings. Musculoskeletal: Chest wall soft tissues are unremarkable. No acute bony abnormality. IMPRESSION: Peripheral, somewhat nodular airspace disease scattered throughout the lungs bilaterally. Density on the prior chest x-ray represents anterior pleural based airspace disease in the right middle lobe and lingula. Findings most compatible with infectious process, possibly multifocal pneumonia or indolent infection such as MAI. This could be followed with repeat chest CT in 3 months after treatment to ensure stability or regression of disease. Trace right pleural effusion. Cardiomegaly. Scattered coronary artery and aortic calcifications. Electronically Signed   By: Rolm Baptise M.D.   On: 12/18/2017 12:47   US Abdomen Limited Ruq  Result Date: 12/16/2017 CLINICAL DATA:  Blood ALP increased. Previous splenectomy. Hereditary spherocytosis. EXAM: ULTRASOUND ABDOMEN LIMITED RIGHT UPPER QUADRANT COMPARISON:  None. FINDINGS: Gallbladder: No gallstones or wall thickening visualized. No sonographic Murphy sign noted by sonographer. Common bile duct: Diameter: 4.4 mm. Liver: No focal lesion identified. Within normal limits in parenchymal echogenicity. Portal vein is patent on color Doppler imaging with normal direction of blood flow towards the liver. Tiny right pleural effusion. IMPRESSION: No acute findings. Electronically Signed   By: Marin Olp M.D.   On:  12/16/2017 10:08    ASSESSMENT & PLAN:   77 y.o. female with  1. 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 R/o Essential thrombocytosis vs reactive from recent Pneumonia. PLAN -Discussed patient's most recent labs from 12/18/17, PLT at 779k, ANC at 11.2k -Discussed the risks of blood clots with elevated platelets -Pt has had a splenectomy at age 68, which was 4 years ago -Pt had recent pneumonia in July 2019 -Labs prior to the last month have not yet been made available to evaluate her platelet baseline -Reactive thrombocytosis from recent pneumonia infection ? -Baseline thrombocytosis from spleen's absence ? -Will order blood tests today to rule out a clonal thrombocytosis -Pt is on Eliquis for Afib which is protective for her thrombocytosis -Will see the pt back in 2 weeks -If pt has stroke-like symptoms, CP and SOB, vision changes, or new painful/swollen LEs, she understands that she will present to the ED   2)  Patient Active Problem List   Diagnosis Date Noted  . HTN (hypertension) 12/16/2017  . CAP (community acquired pneumonia) 12/16/2017  . New onset atrial fibrillation ([place]) 12/16/2017  . Acute encephalopathy 12/16/2017  . Abnormal LFTs 12/16/2017  -continue f/u with PCP  Labs today RTC with Dr Irene Limbo with 2 weeks   All of the patients questions were answered with apparent satisfaction. The patient knows to call the clinic with any problems, questions or concerns.  The total time spent in the appt was 45 minutes and more than 50% was on counseling and direct patient cares.    Sullivan Lone MD MS AAHIVMS [facility] Hematology/Oncology Physician [facility]  (Office):       [phone] (Work cell):  [phone] (Fax):           [phone]  01/04/2018 12:30 PM  I, Baldwin [place], am acting as a scribe for Dr. Irene Limbo  .I have reviewed the above documentation for accuracy and completeness, and I agree with the  above. Brunetta Genera MD MS

## 2018-01-04 ENCOUNTER — Inpatient Hospital Stay: Payer: Medicare HMO | Attending: Hematology | Admitting: Hematology

## 2018-01-04 ENCOUNTER — Encounter: Payer: Self-pay | Admitting: Hematology

## 2018-01-04 ENCOUNTER — Telehealth: Payer: Self-pay | Admitting: Hematology

## 2018-01-04 ENCOUNTER — Inpatient Hospital Stay: Payer: Medicare HMO

## 2018-01-04 VITALS — BP 134/79 | HR 90 | Temp 97.6°F | Resp 18 | Ht 62.0 in | Wt 142.4 lb

## 2018-01-04 DIAGNOSIS — Z79899 Other long term (current) drug therapy: Secondary | ICD-10-CM | POA: Diagnosis not present

## 2018-01-04 DIAGNOSIS — Z9081 Acquired absence of spleen: Secondary | ICD-10-CM | POA: Diagnosis not present

## 2018-01-04 DIAGNOSIS — I10 Essential (primary) hypertension: Secondary | ICD-10-CM | POA: Diagnosis not present

## 2018-01-04 DIAGNOSIS — D580 Hereditary spherocytosis: Secondary | ICD-10-CM

## 2018-01-04 DIAGNOSIS — I4891 Unspecified atrial fibrillation: Secondary | ICD-10-CM | POA: Diagnosis not present

## 2018-01-04 DIAGNOSIS — I480 Paroxysmal atrial fibrillation: Secondary | ICD-10-CM | POA: Insufficient documentation

## 2018-01-04 DIAGNOSIS — D473 Essential (hemorrhagic) thrombocythemia: Secondary | ICD-10-CM

## 2018-01-04 DIAGNOSIS — E785 Hyperlipidemia, unspecified: Secondary | ICD-10-CM | POA: Diagnosis not present

## 2018-01-04 DIAGNOSIS — Z7901 Long term (current) use of anticoagulants: Secondary | ICD-10-CM | POA: Diagnosis not present

## 2018-01-04 DIAGNOSIS — D75839 Thrombocytosis, unspecified: Secondary | ICD-10-CM

## 2018-01-04 LAB — CBC WITH DIFFERENTIAL/PLATELET
Basophils Absolute: 0 10*3/uL (ref 0.0–0.1)
Basophils Relative: 0 %
Eosinophils Absolute: 0.1 10*3/uL (ref 0.0–0.5)
Eosinophils Relative: 1 %
HCT: 40.9 % (ref 34.8–46.6)
Hemoglobin: 14.3 g/dL (ref 11.6–15.9)
Lymphocytes Relative: 29 %
Lymphs Abs: 2.2 10*3/uL (ref 0.9–3.3)
MCH: 33.6 pg (ref 25.1–34.0)
MCHC: 35 g/dL (ref 31.5–36.0)
MCV: 96.2 fL (ref 79.5–101.0)
Monocytes Absolute: 0.6 10*3/uL (ref 0.1–0.9)
Monocytes Relative: 8 %
Neutro Abs: 4.7 10*3/uL (ref 1.5–6.5)
Neutrophils Relative %: 62 %
Platelets: 576 10*3/uL — ABNORMAL HIGH (ref 145–400)
RBC: 4.25 MIL/uL (ref 3.70–5.45)
RDW: 12.6 % (ref 11.2–14.5)
WBC: 7.7 10*3/uL (ref 3.9–10.3)

## 2018-01-04 LAB — CMP (CANCER CENTER ONLY)
ALT: 16 U/L (ref 0–44)
AST: 27 U/L (ref 15–41)
Albumin: 4 g/dL (ref 3.5–5.0)
Alkaline Phosphatase: 167 U/L — ABNORMAL HIGH (ref 38–126)
Anion gap: 11 (ref 5–15)
BUN: 9 mg/dL (ref 8–23)
CO2: 26 mmol/L (ref 22–32)
Calcium: 9.4 mg/dL (ref 8.9–10.3)
Chloride: 104 mmol/L (ref 98–111)
Creatinine: 0.87 mg/dL (ref 0.44–1.00)
GFR, Est AFR Am: >60 mL/min (ref >60)
GFR, Estimated: >60 mL/min (ref >60)
Glucose, Bld: 86 mg/dL (ref 70–99)
Potassium: 4.2 mmol/L (ref 3.5–5.1)
Sodium: 141 mmol/L (ref 135–145)
Total Bilirubin: 1.6 mg/dL — ABNORMAL HIGH (ref 0.3–1.2)
Total Protein: 7.5 g/dL (ref 6.5–8.1)

## 2018-01-04 LAB — LACTATE DEHYDROGENASE: LDH: 224 U/L — ABNORMAL HIGH (ref 98–192)

## 2018-01-04 NOTE — Telephone Encounter (Signed)
Scheduled appt per 8/8 los - gave patient AVS and calender per los.  

## 2018-01-05 LAB — FOLATE RBC
Folate, Hemolysate: 255.7 ng/mL
Folate, RBC: 639 ng/mL (ref >498)
Hematocrit: 40 % (ref 34.0–46.6)

## 2018-01-05 LAB — HAPTOGLOBIN: Haptoglobin: 38 mg/dL (ref 34–200)

## 2018-01-15 ENCOUNTER — Encounter: Payer: Self-pay | Admitting: Cardiology

## 2018-01-15 ENCOUNTER — Ambulatory Visit: Payer: Medicare HMO | Admitting: Cardiology

## 2018-01-15 ENCOUNTER — Encounter: Payer: Self-pay | Admitting: *Deleted

## 2018-01-15 VITALS — BP 118/70 | HR 90 | Ht 62.0 in | Wt 144.8 lb

## 2018-01-15 DIAGNOSIS — I480 Paroxysmal atrial fibrillation: Secondary | ICD-10-CM | POA: Diagnosis not present

## 2018-01-15 DIAGNOSIS — Z7901 Long term (current) use of anticoagulants: Secondary | ICD-10-CM | POA: Diagnosis not present

## 2018-01-15 NOTE — Progress Notes (Signed)
Cardiology Office Note:    Date:  01/15/2018   ID:  Shinika, Estelle [birth year], MRN [id number]  PCP:  Helane Rima, MD  Cardiologist:  Candee Furbish, MD   Referring MD: Helane Rima, MD     History of Present Illness:    Kayla Booth is a 77 y.o. female with a hx of thrombocytosis, pneumonia with hospitalization on 12/15/2017 with atrial fibrillation on Eliquis here for follow-up posthospitalization.  Echocardiogram showed low normal ejection fraction of 50 to 55% with mild AR.  She had elevated LFTs during the time.  She believes that atrial fibrillation was triggered by pneumonia.  She was asymptomatic.  Trying to decide whether we perform cardioversion after 3 weeks of anticoagulation.  On Eliquis.  Overall she has been feeling quite well.  She does not sense her atrial fibrillation.  No chest pain fevers chills nausea vomiting syncope bleeding.   Past Medical History:  Diagnosis Date  . Abnormal LFTs 12/16/2017  . Acute encephalopathy 12/16/2017  . CAP (community acquired pneumonia) 12/16/2017  . Hereditary spherocytosis ([place])   . HTN (hypertension)   . Hyperlipidemia   . New onset atrial fibrillation ([place]) 12/16/2017    Past Surgical History:  Procedure Laterality Date  . SPLENECTOMY      Current Medications: Current Meds  Medication Sig  . acetaminophen (TYLENOL) 500 MG tablet Take 500 mg by mouth every 6 (six) hours as needed for pain.  Marland Kitchen apixaban (ELIQUIS) 5 MG TABS tablet Take 1 tablet (5 mg total) by mouth 2 (two) times daily.  . calcium citrate-vitamin D 500-400 MG-UNIT chewable tablet Chew 1 tablet by mouth daily.  . cholecalciferol (VITAMIN D) 400 units TABS tablet Take 400 Units by mouth daily.  . Cyanocobalamin (VITAMIN B-12 PO) Take 1 tablet by mouth daily.  . metoprolol tartrate (LOPRESSOR) 25 MG tablet Take 1 tablet (25 mg total) by mouth 2 (two) times daily.     Allergies:   Aspirin   Social History   Socioeconomic History  . Marital  status: Divorced    Spouse name: Not on file  . Number of children: Not on file  . Years of education: Not on file  . Highest education level: Not on file  Occupational History  . Not on file  Social Needs  . Financial resource strain: Not on file  . Food insecurity:    Worry: Not on file    Inability: Not on file  . Transportation needs:    Medical: Not on file    Non-medical: Not on file  Tobacco Use  . Smoking status: Never Smoker  . Smokeless tobacco: Never Used  Substance and Sexual Activity  . Alcohol use: No  . Drug use: No  . Sexual activity: Not on file  Lifestyle  . Physical activity:    Days per week: Not on file    Minutes per session: Not on file  . Stress: Not on file  Relationships  . Social connections:    Talks on phone: Not on file    Gets together: Not on file    Attends religious service: Not on file    Active member of club or organization: Not on file    Attends meetings of clubs or organizations: Not on file    Relationship status: Not on file  Other Topics Concern  . Not on file  Social History Narrative  . Not on file     Family History: The patient's family history includes Diabetes  in her mother; Heart failure in her unknown relative; Stroke in her mother.  ROS:   Please see the history of present illness.     All other systems reviewed and are negative.  EKGs/Labs/Other Studies Reviewed:    The following studies were reviewed today: Hospital notes, echocardiogram, lab work, oncology note personally reviewed  EKG:  EKG is  ordered today.  The ekg ordered today demonstrates 01/15/2018-atrial fibrillation 78 with no other abnormality's.  Recent Labs: 12/16/2017: Magnesium 1.7; TSH 1.234 01/04/2018: ALT 16; BUN 9; Creatinine 0.87; Hemoglobin 14.3; Platelets 576; Potassium 4.2; Sodium 141  Recent Lipid Panel No results found for: CHOL, TRIG, HDL, CHOLHDL, VLDL, LDLCALC, LDLDIRECT  Physical Exam:    VS:  BP 118/70   Pulse 90   Ht 5'  2" (1.575 m)   Wt 144 lb 12.8 oz (65.7 kg)   SpO2 (!) 80%   BMI 26.48 kg/m     Wt Readings from Last 3 Encounters:  01/15/18 144 lb 12.8 oz (65.7 kg)  01/04/18 142 lb 6.4 oz (64.6 kg)  12/19/17 144 lb 13.5 oz (65.7 kg)     GEN:  Well nourished, well developed in no acute distress HEENT: Normal NECK: No JVD; No carotid bruits LYMPHATICS: No lymphadenopathy CARDIAC: Irreg, no murmurs, rubs, gallops RESPIRATORY:  Clear to auscultation without rales, wheezing or rhonchi  ABDOMEN: Soft, non-tender, non-distended MUSCULOSKELETAL:  No edema; No deformity  SKIN: Warm and dry NEUROLOGIC:  Alert and oriented x 3 PSYCHIATRIC:  Normal affect   ASSESSMENT:    1. PAF (paroxysmal atrial fibrillation) ([place])   2. Chronic anticoagulation    PLAN:    In order of problems listed above:  Paroxysmal atrial fibrillation in the setting of pneumonia - This was during hospitalization in July 2019.  Eliquis was started on 12/18/2017.  She was asymptomatic.  Rate controlled.  I think it makes sense for [place] to pursue cardioversion to at least give her an opportunity at normal sinus rhythm once again.  She states that she did have a son that cardioversion did not work on in the past.  Risks and benefits of procedure discussed.   Medication Adjustments/Labs and Tests Ordered: Current medicines are reviewed at length with the patient today.  Concerns regarding medicines are outlined above.  Orders Placed This Encounter  Procedures  . EKG 12-Lead   No orders of the defined types were placed in this encounter.   Patient Instructions  Medication Instructions:  The current medical regimen is effective;  continue present plan and medications.  Labwork: None  Testing/Procedures: Your physician has requested that you have a Cardioversion.. Electrical Cardioversion uses a jolt of electricity to your heart either through paddles or wired patches attached to your chest. This is a controlled, usually  prescheduled, procedure. This procedure is done at the hospital and you are not awake during the procedure. You usually go home the day of the procedure. Please see the instruction sheet given to you today for more information.  Follow-Up: Follow up after your cardioversion.  If you need a refill on your cardiac medications before your next appointment, please call your pharmacy.  Thank you for choosing [facility]!!        Signed, Candee Furbish, MD  01/15/2018 10:36 AM    [place]

## 2018-01-15 NOTE — Patient Instructions (Addendum)
Medication Instructions:  The current medical regimen is effective;  continue present plan and medications.  Labwork: None  Testing/Procedures: Your physician has requested that you have a Cardioversion.. Electrical Cardioversion uses a jolt of electricity to your heart either through paddles or wired patches attached to your chest. This is a controlled, usually prescheduled, procedure. This procedure is done at the hospital and you are not awake during the procedure. You usually go home the day of the procedure. Please see the instruction sheet given to you today for more information.  Follow-Up: Follow up after your cardioversion.  If you need a refill on your cardiac medications before your next appointment, please call your pharmacy.  Thank you for choosing [facility]!!

## 2018-01-15 NOTE — H&P (View-Only) (Signed)
Cardiology Office Note:    Date:  01/15/2018   ID:  Kayla Booth, Kayla Booth [birth year], MRN [id number]  PCP:  Helane Rima, MD  Cardiologist:  Candee Furbish, MD   Referring MD: Helane Rima, MD     History of Present Illness:    Kayla Booth is a 77 y.o. female with a hx of thrombocytosis, pneumonia with hospitalization on 12/15/2017 with atrial fibrillation on Eliquis here for follow-up posthospitalization.  Echocardiogram showed low normal ejection fraction of 50 to 55% with mild AR.  She had elevated LFTs during the time.  She believes that atrial fibrillation was triggered by pneumonia.  She was asymptomatic.  Trying to decide whether we perform cardioversion after 3 weeks of anticoagulation.  On Eliquis.  Overall she has been feeling quite well.  She does not sense her atrial fibrillation.  No chest pain fevers chills nausea vomiting syncope bleeding.   Past Medical History:  Diagnosis Date  . Abnormal LFTs 12/16/2017  . Acute encephalopathy 12/16/2017  . CAP (community acquired pneumonia) 12/16/2017  . 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 HTN (hypertension)   . Hyperlipidemia   . New onset atrial fibrillation ([place]) 12/16/2017    Past Surgical History:  Procedure Laterality Date  . SPLENECTOMY      Current Medications: Current Meds  Medication Sig  . acetaminophen (TYLENOL) 500 MG tablet Take 500 mg by mouth every 6 (six) hours as needed for pain.  Marland Kitchen apixaban (ELIQUIS) 5 MG TABS tablet Take 1 tablet (5 mg total) by mouth 2 (two) times daily.  . calcium citrate-vitamin D 500-400 MG-UNIT chewable tablet Chew 1 tablet by mouth daily.  . cholecalciferol (VITAMIN D) 400 units TABS tablet Take 400 Units by mouth daily.  . Cyanocobalamin (VITAMIN B-12 PO) Take 1 tablet by mouth daily.  . metoprolol tartrate (LOPRESSOR) 25 MG tablet Take 1 tablet (25 mg total) by mouth 2 (two) times daily.     Allergies:   Aspirin   Social History   Socioeconomic History  . Marital  status: Divorced    Spouse name: Not on file  . Number of children: Not on file  . Years of education: Not on file  . Highest education level: Not on file  Occupational History  . Not on file  Social Needs  . Financial resource strain: Not on file  . Food insecurity:    Worry: Not on file    Inability: Not on file  . Transportation needs:    Medical: Not on file    Non-medical: Not on file  Tobacco Use  . Smoking status: Never Smoker  . Smokeless tobacco: Never Used  Substance and Sexual Activity  . Alcohol use: No  . Drug use: No  . Sexual activity: Not on file  Lifestyle  . Physical activity:    Days per week: Not on file    Minutes per session: Not on file  . Stress: Not on file  Relationships  . Social connections:    Talks on phone: Not on file    Gets together: Not on file    Attends religious service: Not on file    Active member of club or organization: Not on file    Attends meetings of clubs or organizations: Not on file    Relationship status: Not on file  Other Topics Concern  . Not on file  Social History Narrative  . Not on file     Family History: The patient's family history includes Diabetes  in her mother; Heart failure in her unknown relative; Stroke in her mother.  ROS:   Please see the history of present illness.     All other systems reviewed and are negative.  EKGs/Labs/Other Studies Reviewed:    The following studies were reviewed today: Hospital notes, echocardiogram, lab work, oncology note personally reviewed  EKG:  EKG is  ordered today.  The ekg ordered today demonstrates 01/15/2018-atrial fibrillation 78 with no other abnormality's.  Recent Labs: 12/16/2017: Magnesium 1.7; TSH 1.234 01/04/2018: ALT 16; BUN 9; Creatinine 0.87; Hemoglobin 14.3; Platelets 576; Potassium 4.2; Sodium 141  Recent Lipid Panel No results found for: CHOL, TRIG, HDL, CHOLHDL, VLDL, LDLCALC, LDLDIRECT  Physical Exam:    VS:  BP 118/70   Pulse 90   Ht 5'  2" (1.575 m)   Wt 144 lb 12.8 oz (65.7 kg)   SpO2 (!) 80%   BMI 26.48 kg/m     Wt Readings from Last 3 Encounters:  01/15/18 144 lb 12.8 oz (65.7 kg)  01/04/18 142 lb 6.4 oz (64.6 kg)  12/19/17 144 lb 13.5 oz (65.7 kg)     GEN:  Well nourished, well developed in no acute distress HEENT: Normal NECK: No JVD; No carotid bruits LYMPHATICS: No lymphadenopathy CARDIAC: Irreg, no murmurs, rubs, gallops RESPIRATORY:  Clear to auscultation without rales, wheezing or rhonchi  ABDOMEN: Soft, non-tender, non-distended MUSCULOSKELETAL:  No edema; No deformity  SKIN: Warm and dry NEUROLOGIC:  Alert and oriented x 3 PSYCHIATRIC:  Normal affect   ASSESSMENT:    1. PAF (paroxysmal atrial fibrillation) ([place])   2. Chronic anticoagulation    PLAN:    In order of problems listed above:  Paroxysmal atrial fibrillation in the setting of pneumonia - This was during hospitalization in July 2019.  Eliquis was started on 12/18/2017.  She was asymptomatic.  Rate controlled.  I think it makes sense for [place] to pursue cardioversion to at least give her an opportunity at normal sinus rhythm once again.  She states that she did have a son that cardioversion did not work on in the past.  Risks and benefits of procedure discussed.   Medication Adjustments/Labs and Tests Ordered: Current medicines are reviewed at length with the patient today.  Concerns regarding medicines are outlined above.  Orders Placed This Encounter  Procedures  . EKG 12-Lead   No orders of the defined types were placed in this encounter.   Patient Instructions  Medication Instructions:  The current medical regimen is effective;  continue present plan and medications.  Labwork: None  Testing/Procedures: Your physician has requested that you have a Cardioversion.. Electrical Cardioversion uses a jolt of electricity to your heart either through paddles or wired patches attached to your chest. This is a controlled, usually  prescheduled, procedure. This procedure is done at the hospital and you are not awake during the procedure. You usually go home the day of the procedure. Please see the instruction sheet given to you today for more information.  Follow-Up: Follow up after your cardioversion.  If you need a refill on your cardiac medications before your next appointment, please call your pharmacy.  Thank you for choosing [facility]!!        Signed, Candee Furbish, MD  01/15/2018 10:36 AM    [place]

## 2018-01-17 ENCOUNTER — Other Ambulatory Visit: Payer: Self-pay

## 2018-01-17 LAB — MPN-ET/MYELOFIBROSIS (JAK2 V617F-MPL515-CALR)

## 2018-01-17 MED ORDER — METOPROLOL TARTRATE 25 MG PO TABS: 25.0000 mg | ORAL_TABLET | Freq: Two times a day (BID) | ORAL | 0 refills | Status: DC

## 2018-01-19 ENCOUNTER — Encounter (HOSPITAL_COMMUNITY)
Admission: RE | Disposition: A | Discharge status: Home / Self Care | Payer: Self-pay | Source: Ambulatory Visit | Attending: Cardiology

## 2018-01-19 ENCOUNTER — Ambulatory Visit (HOSPITAL_COMMUNITY): Payer: Medicare HMO | Admitting: Certified Registered"

## 2018-01-19 ENCOUNTER — Ambulatory Visit (HOSPITAL_COMMUNITY)
Admission: RE | Admit: 2018-01-19 | Discharge: 2018-01-19 | Disposition: A | Discharge status: Home / Self Care | Payer: Medicare HMO | Source: Ambulatory Visit | Attending: Cardiology | Admitting: Cardiology

## 2018-01-19 ENCOUNTER — Encounter (HOSPITAL_COMMUNITY): Payer: Self-pay | Admitting: *Deleted

## 2018-01-19 DIAGNOSIS — Z9081 Acquired absence of spleen: Secondary | ICD-10-CM | POA: Diagnosis not present

## 2018-01-19 DIAGNOSIS — I480 Paroxysmal atrial fibrillation: Secondary | ICD-10-CM | POA: Diagnosis present

## 2018-01-19 DIAGNOSIS — E785 Hyperlipidemia, unspecified: Secondary | ICD-10-CM | POA: Diagnosis not present

## 2018-01-19 DIAGNOSIS — I10 Essential (primary) hypertension: Secondary | ICD-10-CM | POA: Insufficient documentation

## 2018-01-19 DIAGNOSIS — Z8249 Family history of ischemic heart disease and other diseases of the circulatory system: Secondary | ICD-10-CM | POA: Diagnosis not present

## 2018-01-19 DIAGNOSIS — Z823 Family history of stroke: Secondary | ICD-10-CM | POA: Diagnosis not present

## 2018-01-19 DIAGNOSIS — Z7901 Long term (current) use of anticoagulants: Secondary | ICD-10-CM | POA: Diagnosis not present

## 2018-01-19 DIAGNOSIS — I4811 Longstanding persistent atrial fibrillation: Secondary | ICD-10-CM

## 2018-01-19 DIAGNOSIS — Z886 Allergy status to analgesic agent status: Secondary | ICD-10-CM | POA: Insufficient documentation

## 2018-01-19 DIAGNOSIS — Z79899 Other long term (current) drug therapy: Secondary | ICD-10-CM | POA: Diagnosis not present

## 2018-01-19 HISTORY — PX: CARDIOVERSION: SHX1299

## 2018-01-19 SURGERY — CARDIOVERSION
Anesthesia: General

## 2018-01-19 MED ORDER — SODIUM CHLORIDE 0.9 % IV SOLN: INTRAVENOUS | Status: DC

## 2018-01-19 MED ORDER — PROPOFOL 500 MG/50ML IV EMUL
INTRAVENOUS | Status: DC | PRN
  Administered 2018-01-19: 10 ug via INTRAVENOUS
  Administered 2018-01-19: 40 ug via INTRAVENOUS

## 2018-01-19 MED ORDER — LIDOCAINE 2% (20 MG/ML) 5 ML SYRINGE
INTRAMUSCULAR | Status: DC | PRN
  Administered 2018-01-19: 60 mg via INTRAVENOUS

## 2018-01-19 MED ORDER — SODIUM CHLORIDE 0.9 % IV SOLN
INTRAVENOUS | Status: DC | PRN
  Administered 2018-01-19: 10:00:00 via INTRAVENOUS

## 2018-01-19 NOTE — Transfer of Care (Signed)
Immediate Anesthesia Transfer of Care Note  Patient: KENSLEI HEARTY  Procedure(s) Performed: CARDIOVERSION (N/A )  Patient Location: PACU  Anesthesia Type:General  Level of Consciousness: drowsy  Airway & Oxygen Therapy: Patient Spontanous Breathing and Patient connected to face mask oxygen  Post-op Assessment: Report given to RN and Post -op Vital signs reviewed and stable  Post vital signs: Reviewed and stable  Last Vitals:  Vitals Value Taken Time  BP 165/97 01/19/2018 10:21 AM  Temp    Pulse 87 01/19/2018 10:23 AM  Resp 21 01/19/2018 10:23 AM  SpO2 100 % 01/19/2018 10:23 AM    Last Pain:  Vitals:   01/19/18 0944  TempSrc: Oral  PainSc: 0-No pain         Complications: No apparent anesthesia complications

## 2018-01-19 NOTE — Interval H&P Note (Signed)
History and Physical Interval Note:  01/19/2018 10:01 AM  Kayla Booth  has presented today for surgery, with the diagnosis of atrial fibrillation  The various methods of treatment have been discussed with the patient and family. After consideration of risks, benefits and other options for treatment, the patient has consented to  Procedure(s): CARDIOVERSION (N/A) as a surgical intervention .  The patient's history has been reviewed, patient examined, no change in status, stable for surgery.  I have reviewed the patient's chart and labs.  Questions were answered to the patient's satisfaction.     Jenkins Rouge

## 2018-01-19 NOTE — CV Procedure (Signed)
DCC: Anesthesia: Propofol/Lidocaine Dr Jasmine December  [facility] x 4 120/150/200/200 joules Failed to convert Remained in afib rates 90-100 no sinus beats at all No immediate complications  Discussed with Dr Marlou Porch Will likely rate control and continue anticoagulation  Jenkins Rouge

## 2018-01-19 NOTE — Anesthesia Preprocedure Evaluation (Signed)
Anesthesia Evaluation  Patient identified by MRN, date of birth, ID band Patient awake    Reviewed: Allergy & Precautions, NPO status , Patient's Chart, lab work & pertinent test results  Airway Mallampati: I  TM Distance: >3 FB Neck ROM: Full    Dental no notable dental hx. (+) Dental Advisory Given, Teeth Intact   Pulmonary    Pulmonary exam normal breath sounds clear to auscultation       Cardiovascular hypertension, Normal cardiovascular exam+ dysrhythmias Atrial Fibrillation  Rhythm:Irregular Rate:Normal     Neuro/Psych negative neurological ROS  negative psych ROS   GI/Hepatic negative GI ROS, Neg liver ROS,   Endo/Other  negative endocrine ROS  Renal/GU negative Renal ROS  negative genitourinary   Musculoskeletal negative musculoskeletal ROS (+)   Abdominal   Peds  Hematology  (+) anemia ,   Anesthesia Other Findings   Reproductive/Obstetrics                             Anesthesia Physical Anesthesia Plan  ASA: II  Anesthesia Plan: General   Post-op Pain Management:    Induction: Intravenous  PONV Risk Score and Plan: 3 and Propofol infusion and Treatment may vary due to age or medical condition  Airway Management Planned: Natural Airway and Simple Face Mask  Additional Equipment:   Intra-op Plan:   Post-operative Plan:   Informed Consent: I have reviewed the patients History and Physical, chart, labs and discussed the procedure including the risks, benefits and alternatives for the proposed anesthesia with the patient or authorized representative who has indicated his/her understanding and acceptance.   Dental advisory given  Plan Discussed with: CRNA  Anesthesia Plan Comments:         Anesthesia Quick Evaluation

## 2018-01-19 NOTE — Discharge Instructions (Signed)
Electrical Cardioversion, Care After °This sheet gives you information about how to care for yourself after your procedure. Your health care provider may also give you more specific instructions. If you have problems or questions, contact your health care provider. °What can I expect after the procedure? °After the procedure, it is common to have: °· Some redness on the skin where the shocks were given. ° °Follow these instructions at home: °· Do not drive for 24 hours if you were given a medicine to help you relax (sedative). °· Take over-the-counter and prescription medicines only as told by your health care provider. °· Ask your health care provider how to check your pulse. Check it often. °· Rest for 48 hours after the procedure or as told by your health care provider. °· Avoid or limit your caffeine use as told by your health care provider. °Contact a health care provider if: °· You feel like your heart is beating too quickly or your pulse is not regular. °· You have a serious muscle cramp that does not go away. °Get help right away if: °· You have discomfort in your chest. °· You are dizzy or you feel faint. °· You have trouble breathing or you are short of breath. °· Your speech is slurred. °· You have trouble moving an arm or leg on one side of your body. °· Your fingers or toes turn cold or blue. °This information is not intended to replace advice given to you by your health care provider. Make sure you discuss any questions you have with your health care provider. °Document Released: 03/06/2013 Document Revised: 12/18/2015 Document Reviewed: 11/20/2015 °Elsevier Interactive Patient Education © 2018 [employer] ° °

## 2018-01-19 NOTE — Anesthesia Postprocedure Evaluation (Signed)
Anesthesia Post Note  Patient: Kayla Booth  Procedure(s) Performed: CARDIOVERSION (N/A )     Patient location during evaluation: PACU Anesthesia Type: General Level of consciousness: awake and alert Pain management: pain level controlled Vital Signs Assessment: post-procedure vital signs reviewed and stable Respiratory status: spontaneous breathing, nonlabored ventilation, respiratory function stable and patient connected to nasal cannula oxygen Cardiovascular status: blood pressure returned to baseline and stable Postop Assessment: no apparent nausea or vomiting Anesthetic complications: no    Last Vitals:  Vitals:   01/19/18 1030 01/19/18 1040  BP: 124/72 134/71  Pulse: 81 72  Resp: (!) 21 16  Temp: 36.6 C   SpO2: 100% 97%    Last Pain:  Vitals:   01/19/18 1040  TempSrc:   PainSc: 0-No pain                 Diaz Crago L Carel Carrier

## 2018-01-22 NOTE — Progress Notes (Signed)
HEMATOLOGY/ONCOLOGY CLINIC NOTE  Date of Service: 01/23/2018  Patient Care Team: Helane Rima, MD as PCP - General (Family Medicine) Jerline Pain, MD as PCP - Cardiology (Cardiology)  CHIEF COMPLAINTS/PURPOSE OF CONSULTATION:  Thrombocytosis  HISTORY OF PRESENTING ILLNESS:   Kayla Booth is a wonderful 77 y.o. female who has been referred to [place] by Dr. Helane Rima for evaluation and management of Thrombocytosis. The pt reports that she is doing well overall.   The pt notes that she has recovered well from her recent Pneumonia, being discharged on 12/19/17. She has finished her antibiotic course. She denies having frequent infections.   The pt notes that she has maintained follow up with her PCP with annual visits but has not been aware of her increased platelet counts until recently.  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 She is taking Eliquis for Afib.   The pt reports that she has never needed a blood transfusion, denies concerns for anemia, and has not taken folic acid despite a splenectomy.   She will be seeing Dr. Paulita Fujita in GI for her recently elevated Alkaline Phosphatase.   She notes that she takes Vitamin B12 and Vitamin D. She does not use a walker or a cane.   Most recent lab results (12/18/17) of CBC is as follows: all values are WNL except for WBC at 16.1k, RBC at 3.86, PLT at 779k., Immature grans abs at 200, ANC at 11.2k, Monocytes abs at 1.2k.   On review of systems, pt reports recently resolved Pneumonia, good energy levels, and denies leg swelling, CP, SOB, frequent infections, abdominal pains, changes in bowel habits, urinary discomfort or difficulty, vision changes, and any other symptoms.   On PMHx the pt reports hereditary spherocytosis with splenectomy at age 24, Osteoporosis, Afib.  Interval History:   Kayla Booth returns today for management and evaluation of her thrombocytosis. The  patient's last visit with [place] was on 01/04/18. The pt reports that she is doing well overall.   The pt reports that she continues on 24m Eliquis for her Afib. She had a cardioversion on 01/19/18 with Dr. PJenkins Rouge which failed to convert. The pt notes that she has not developed any ne concerns in the brief interim.   Lab results (01/04/18) of CBC w/diff, CMP, and Reticulocytes is as follows: all values are WNL except for PLT at 576k, Alk Phos at 16, Total Bilirubin at 1.6. LDH 01/04/18 was at 224 01/04/18 Haptoglobin and Folate were both WNL.  On review of systems, pt reports good energy levels, and denies abdominal pains, leg swelling, andany other symptoms.    MEDICAL HISTORY:  Past Medical History:  Diagnosis Date  . Abnormal LFTs 12/16/2017  . Acute encephalopathy 12/16/2017  . CAP (community acquired pneumonia) 12/16/2017  . [place]   . HTN (hypertension)   . Hyperlipidemia   . New onset atrial fibrillation ([place] 12/16/2017    SURGICAL HISTORY: Past Surgical History:  Procedure Laterality Date  . CARDIOVERSION N/A 01/19/2018   Procedure: CARDIOVERSION;  Surgeon: NJosue Hector MD;  [facility];  Service: Cardiovascular;  Laterality: N/A;  . SPLENECTOMY      SOCIAL HISTORY: Social History   Socioeconomic History  . Marital status: Divorced    Spouse name: Not on file  . Number of children: Not on file  . Years of education: Not on file  . Highest education level: Not on file  Occupational History  . Not on file  Social Needs  . Financial resource strain: Not on file  . Food insecurity:    Worry: Not on file    Inability: Not on file  . Transportation needs:    Medical: Not on file    Non-medical: Not on file  Tobacco Use  . Smoking status: Never Smoker  . Smokeless tobacco: Never Used  Substance and Sexual Activity  . Alcohol use: No  . Drug use: No  . Sexual activity: Not on file  Lifestyle  . Physical activity:    Days per  week: Not on file    Minutes per session: Not on file  . Stress: Not on file  Relationships  . Social connections:    Talks on phone: Not on file    Gets together: Not on file    Attends religious service: Not on file    Active member of club or organization: Not on file    Attends meetings of clubs or organizations: Not on file    Relationship status: Not on file  . Intimate partner violence:    Fear of current or ex partner: Not on file    Emotionally abused: Not on file    Physically abused: Not on file    Forced sexual activity: Not on file  Other Topics Concern  . Not on file  Social History Narrative  . Not on file    FAMILY HISTORY: Family History  Problem Relation Age of Onset  . Heart failure Unknown   . Diabetes Mother   . Stroke Mother     ALLERGIES:  is allergic to aspirin.  MEDICATIONS:  Current Outpatient Medications  Medication Sig Dispense Refill  . acetaminophen (TYLENOL) 500 MG tablet Take 500 mg by mouth every 6 (six) hours as needed for pain.    Marland Kitchen apixaban (ELIQUIS) 5 MG TABS tablet Take 1 tablet (5 mg total) by mouth 2 (two) times daily. 60 tablet 1  . Calcium Carb-Cholecalciferol (CALCIUM 500 +D) 500-400 MG-UNIT TABS Take 1 tablet by mouth daily.    . cholecalciferol (VITAMIN D) 400 units TABS tablet Take 400 Units by mouth daily.    . metoprolol tartrate (LOPRESSOR) 25 MG tablet Take 1 tablet (25 mg total) by mouth 2 (two) times daily. 180 tablet 0  . Omega-3 Fatty Acids (OMEGA-3 PO) Take 520 mg by mouth daily.    . vitamin B-12 (CYANOCOBALAMIN) 500 MCG tablet Take 500 mcg by mouth daily.      No current facility-administered medications for this visit.     REVIEW OF SYSTEMS:    A 10+ POINT REVIEW OF SYSTEMS WAS OBTAINED including neurology, dermatology, psychiatry, cardiac, respiratory, lymph, extremities, GI, GU, Musculoskeletal, constitutional, breasts, reproductive, HEENT.  All pertinent positives are noted in the HPI.  All others are  negative.   PHYSICAL EXAMINATION:  . Vitals:   01/23/18 1109  BP: (!) 158/84  Pulse: 86  Resp: 18  Temp: 98.5 F (36.9 C)  SpO2: 98%   Filed Weights   01/23/18 1109  Weight: 145 lb (65.8 kg)   .Body mass index is 26.52 kg/m.  GENERAL:alert, in no acute distress and comfortable SKIN: no acute rashes, no significant lesions EYES: conjunctiva are pink and non-injected, sclera anicteric OROPHARYNX: MMM, no exudates, no oropharyngeal erythema or ulceration NECK: supple, no JVD LYMPH:  no palpable lymphadenopathy in the cervical, axillary or inguinal regions LUNGS: clear to auscultation b/l with normal respiratory effort HEART: regular rate &  rhythm ABDOMEN:  normoactive bowel sounds , non tender, not distended. No palpable hepatosplenomegaly.  Extremity: no pedal edema PSYCH: alert & oriented x 3 with fluent speech NEURO: no focal motor/sensory deficits   LABORATORY DATA:  I have reviewed the data as listed  . CBC Latest Ref Rng & Units 01/04/2018 01/04/2018 12/19/2017  WBC 3.9 - 10.3 K/uL 7.7 - 11.4(H)  Hemoglobin 11.6 - 15.9 g/dL 14.3 - 13.0  Hematocrit 34.0 - 46.6 % 40.0 40.9 37.8  Platelets 145 - 400 K/uL 576(H) - 810(H)    . CMP Latest Ref Rng & Units 01/04/2018 12/19/2017 12/18/2017  Glucose 70 - 99 mg/dL 86 100(H) 107(H)  BUN 8 - 23 mg/dL 9 <5(L) <5(L)  Creatinine 0.44 - 1.00 mg/dL 0.87 0.71 0.84  Sodium 135 - 145 mmol/L 141 141 142  Potassium 3.5 - 5.1 mmol/L 4.2 4.0 3.8  Chloride 98 - 111 mmol/L 104 105 102  CO2 22 - 32 mmol/L _0 Calcium 8.9 - 10.3 mg/dL 9.4 8.7(L) 8.7(L)  Total Protein 6.5 - 8.1 g/dL 7.5 6.7 6.4(L)  Total Bilirubin 0.3 - 1.2 mg/dL 1.6(H) 0.9 1.0  Alkaline Phos 38 - 126 U/L 167(H) 399(H) 450(H)  AST 15 - 41 U/L 27 41 53(H)  ALT 0 - 44 U/L 16 41 49(H)   01/04/18 Molecular Pathology:    RADIOGRAPHIC STUDIES: I have personally reviewed the radiological images as listed and agreed with the findings in the report. No results  found.  ASSESSMENT & PLAN:   77 y.o. female with  1. 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 Now back to baseline.  PLAN -Pt's labs on initial presentation 12/18/17, PLT at 779k, ANC at 11.2k -Discussed the risks of blood clots with elevated platelets -Pt has had a splenectomy at age 53, which was 4 years ago -Pt had recent pneumonia in July 2019 -If pt has stroke-like symptoms, CP and SOB, vision changes, or new painful/swollen LEs, she understands that she will present to the ED  -Discussed pt labwork from 01/04/18; PLT improved to 576k -No anemia and folate was normal, LDH was slightly elevated at 224 given low level hemolysis from hereditary spherocytosis  -Recommended that the pt continue Vitamin B complex -Discussed the 01/04/18 molecular pathology did not reveal a mutation, and does not indicate Essential Thrombocytosis -Discussed that the patient's previous thrombocytosis is likely explained by her pneumonia infection that was exacerbated by the fact the she does not have a spleen -Discussed that though her counts are high, she is already taking Eliquis which will be protective against developing clots -Advised that the pt stay up to date on her post splenectomy vaccinations -Will see the pt back in one year, sooner if any new concerns   2)  Patient Active Problem List   Diagnosis Date Noted  . HTN (hypertension) 12/16/2017  . CAP (community acquired pneumonia) 12/16/2017  . Paroxysmal A-fib ([place]) 12/16/2017  . Acute encephalopathy 12/16/2017  . Abnormal LFTs 12/16/2017  -continue f/u with PCP   RTC with Dr Irene Limbo in 12 months with labs  All of the patients questions were answered with apparent satisfaction. The patient knows to call the clinic with any problems, questions or concerns.  The total time spent in the appt was 25 minutes and more than 50% was on counseling and direct patient cares.     Sullivan Lone MD [place]  AAHIVMS [facility] Hematology/Oncology Physician [place]  (Office):  [phone] (Work cell):  [phone] (Fax):           [phone]  01/23/2018 11:34 AM  I, Baldwin [place], am acting as a scribe for Dr. Irene Limbo  .I have reviewed the above documentation for accuracy and completeness, and I agree with the above. Brunetta Genera MD

## 2018-01-23 ENCOUNTER — Inpatient Hospital Stay (HOSPITAL_BASED_OUTPATIENT_CLINIC_OR_DEPARTMENT_OTHER): Payer: Medicare HMO | Admitting: Hematology

## 2018-01-23 ENCOUNTER — Encounter (HOSPITAL_COMMUNITY): Payer: Self-pay | Admitting: Cardiovascular Disease

## 2018-01-23 VITALS — BP 158/84 | HR 86 | Temp 98.5°F | Resp 18 | Ht 62.0 in | Wt 145.0 lb

## 2018-01-23 DIAGNOSIS — I480 Paroxysmal atrial fibrillation: Secondary | ICD-10-CM

## 2018-01-23 DIAGNOSIS — Z7901 Long term (current) use of anticoagulants: Secondary | ICD-10-CM

## 2018-01-23 DIAGNOSIS — Z79899 Other long term (current) drug therapy: Secondary | ICD-10-CM | POA: Diagnosis not present

## 2018-01-23 DIAGNOSIS — D580 Hereditary spherocytosis: Secondary | ICD-10-CM

## 2018-01-23 DIAGNOSIS — D473 Essential (hemorrhagic) thrombocythemia: Secondary | ICD-10-CM | POA: Diagnosis not present

## 2018-01-23 DIAGNOSIS — D75839 Thrombocytosis, unspecified: Secondary | ICD-10-CM

## 2018-01-31 ENCOUNTER — Telehealth: Payer: Self-pay

## 2018-01-31 NOTE — Telephone Encounter (Signed)
Spoke with patient with upcoming appointment r/s for 1 year. Per 9/4 los. Will mail a lettere with a calender enclosed

## 2018-02-01 ENCOUNTER — Ambulatory Visit: Payer: 59 | Admitting: Cardiology

## 2018-02-01 ENCOUNTER — Encounter: Payer: Self-pay | Admitting: Cardiology

## 2018-02-01 VITALS — BP 142/80 | HR 89 | Ht 62.0 in | Wt 144.0 lb

## 2018-02-01 DIAGNOSIS — I4821 Permanent atrial fibrillation: Secondary | ICD-10-CM

## 2018-02-01 DIAGNOSIS — I482 Chronic atrial fibrillation: Secondary | ICD-10-CM | POA: Diagnosis not present

## 2018-02-01 DIAGNOSIS — Z7901 Long term (current) use of anticoagulants: Secondary | ICD-10-CM | POA: Diagnosis not present

## 2018-02-01 NOTE — Patient Instructions (Signed)
Medication Instructions:  The current medical regimen is effective;  continue present plan and medications.  Follow-Up: Follow up in 6 months with Laura Ingold, NP.  You will receive a letter in the mail 2 months before you are due.  Please call us when you receive this letter to schedule your follow up appointment.  Follow up in 1 year with Dr. Skains.  You will receive a letter in the mail 2 months before you are due.  Please call us when you receive this letter to schedule your follow up appointment.  If you need a refill on your cardiac medications before your next appointment, please call your pharmacy.  Thank you for choosing [facility]!!     

## 2018-02-01 NOTE — Progress Notes (Signed)
Cardiology Office Note:    Date:  02/01/2018   ID:  Kayla Booth, Kayla Booth [birth year], MRN [id number]  PCP:  Helane Rima, MD  Cardiologist:  Candee Furbish, MD   Referring MD: Helane Rima, MD     History of Present Illness:    Kayla Booth is a 77 y.o. female with a hx of thrombocytosis, pneumonia with hospitalization on 12/15/2017 with atrial fibrillation on Eliquis here for follow-up.  Echocardiogram showed low normal ejection fraction of 50 to 55% with mild AR.  She had elevated LFTs during the time.  She believes that atrial fibrillation was triggered by pneumonia.  She was asymptomatic.   On Eliquis.  Overall she has been feeling quite well.  She does not sense her atrial fibrillation.   We tried an attempt at cardioversion but this was unsuccessful after several shocks.  We will continue with rate control.  No chest pain fevers chills nausea vomiting syncope bleeding   Past Medical History:  Diagnosis Date  . Abnormal LFTs 12/16/2017  . Acute encephalopathy 12/16/2017  . CAP (community acquired pneumonia) 12/16/2017  . 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 HTN (hypertension)   . Hyperlipidemia   . New onset atrial fibrillation ([place]) 12/16/2017    Past Surgical History:  Procedure Laterality Date  . CARDIOVERSION N/A 01/19/2018   Procedure: CARDIOVERSION;  Surgeon: Josue Hector, MD;  Location: [facility] ENDOSCOPY;  Service: Cardiovascular;  Laterality: N/A;  . SPLENECTOMY      Current Medications: Current Meds  Medication Sig  . acetaminophen (TYLENOL) 500 MG tablet Take 500 mg by mouth every 6 (six) hours as needed for pain.  Marland Kitchen apixaban (ELIQUIS) 5 MG TABS tablet Take 1 tablet (5 mg total) by mouth 2 (two) times daily.  . Calcium Carb-Cholecalciferol (CALCIUM 500 +D) 500-400 MG-UNIT TABS Take 1 tablet by mouth daily.  . cholecalciferol (VITAMIN D) 400 units TABS tablet Take 400 Units by mouth daily.  . metoprolol tartrate (LOPRESSOR) 25 MG tablet Take 1 tablet (25 mg  total) by mouth 2 (two) times daily.  . Omega-3 Fatty Acids (OMEGA-3 PO) Take 520 mg by mouth daily.  . vitamin B-12 (CYANOCOBALAMIN) 500 MCG tablet Take 500 mcg by mouth daily.      Allergies:   Aspirin   Social History   Socioeconomic History  . Marital status: Divorced    Spouse name: Not on file  . Number of children: Not on file  . Years of education: Not on file  . Highest education level: Not on file  Occupational History  . Not on file  Social Needs  . Financial resource strain: Not on file  . Food insecurity:    Worry: Not on file    Inability: Not on file  . Transportation needs:    Medical: Not on file    Non-medical: Not on file  Tobacco Use  . Smoking status: Never Smoker  . Smokeless tobacco: Never Used  Substance and Sexual Activity  . Alcohol use: No  . Drug use: No  . Sexual activity: Not on file  Lifestyle  . Physical activity:    Days per week: Not on file    Minutes per session: Not on file  . Stress: Not on file  Relationships  . Social connections:    Talks on phone: Not on file    Gets together: Not on file    Attends religious service: Not on file    Active member of club or organization: Not on  file    Attends meetings of clubs or organizations: Not on file    Relationship status: Not on file  Other Topics Concern  . Not on file  Social History Narrative  . Not on file     Family History: The patient's family history includes Diabetes in her mother; Heart failure in her unknown relative; Stroke in her mother.  ROS:   Please see the history of present illness.     All other systems reviewed and are negative.  EKGs/Labs/Other Studies Reviewed:    The following studies were reviewed today: Hospital notes, echocardiogram, lab work, oncology note personally reviewed  EKG:   01/15/2018-atrial fibrillation 78 with no other abnormality's.  Recent Labs: 12/16/2017: Magnesium 1.7; TSH 1.234 01/04/2018: ALT 16; BUN 9; Creatinine 0.87;  Hemoglobin 14.3; Platelets 576; Potassium 4.2; Sodium 141  Recent Lipid Panel No results found for: CHOL, TRIG, HDL, CHOLHDL, VLDL, LDLCALC, LDLDIRECT  Physical Exam:    VS:  BP (!) 142/80   Pulse 89   Ht 5\' 2"  (1.575 m)   Wt 144 lb (65.3 kg)   SpO2 91%   BMI 26.34 kg/m     Wt Readings from Last 3 Encounters:  02/01/18 144 lb (65.3 kg)  01/23/18 145 lb (65.8 kg)  01/15/18 144 lb 12.8 oz (65.7 kg)     GEN: Well nourished, well developed, in no acute distress  HEENT: normal  Neck: no JVD, carotid bruits, or masses Cardiac: irreg normal rate; no murmurs, rubs, or gallops,no edema  Respiratory:  clear to auscultation bilaterally, normal work of breathing GI: soft, nontender, nondistended, + BS MS: no deformity or atrophy  Skin: warm and dry, no rash Neuro:  Alert and Oriented x 3, Strength and sensation are intact Psych: euthymic mood, full affect   ASSESSMENT:    1. Permanent atrial fibrillation ([place])   2. Chronic anticoagulation    PLAN:    In order of problems listed above:  Permanent atrial fibrillation - This was noted during hospitalization in July 2019 for pneumonia.  Eliquis was started on 12/18/2017.  She was asymptomatic.  Rate controlled.  We tried to give her an opportunity at normal sinus rhythm once again with cardioversion but this failed.  She states that she did have a son that cardioversion did not work on in the past.   -Continue with good rate control.  No changes made to medications today.  Metoprolol.  Eliquis.    Medication Adjustments/Labs and Tests Ordered: Current medicines are reviewed at length with the patient today.  Concerns regarding medicines are outlined above.  No orders of the defined types were placed in this encounter.  No orders of the defined types were placed in this encounter.   Patient Instructions  Medication Instructions:  The current medical regimen is effective;  continue present plan and  medications.  Follow-Up: Follow up in 6 months with Cecilie Kicks, NP.  You will receive a letter in the mail 2 months before you are due.  Please call us when you receive this letter to schedule your follow up appointment.  Follow up in 1 year with Dr. Marlou Porch.  You will receive a letter in the mail 2 months before you are due.  Please call us when you receive this letter to schedule your follow up appointment.  If you need a refill on your cardiac medications before your next appointment, please call your pharmacy.  Thank you for choosing [facility]!!  Signed, Candee Furbish, MD  02/01/2018 10:49 AM    [facility]

## 2018-02-09 LAB — ACID FAST CULTURE WITH REFLEXED SENSITIVITIES (MYCOBACTERIA): Acid Fast Culture: NEGATIVE

## 2018-02-14 ENCOUNTER — Ambulatory Visit: Payer: Medicare HMO | Admitting: Hematology

## 2018-02-14 ENCOUNTER — Other Ambulatory Visit: Payer: Medicare HMO

## 2018-02-15 ENCOUNTER — Other Ambulatory Visit: Payer: Self-pay | Admitting: *Deleted

## 2018-02-16 MED ORDER — APIXABAN 5 MG PO TABS: 5.0000 mg | ORAL_TABLET | Freq: Two times a day (BID) | ORAL | 10 refills | Status: DC

## 2018-04-16 ENCOUNTER — Other Ambulatory Visit: Payer: Self-pay | Admitting: Cardiology

## 2018-08-09 ENCOUNTER — Encounter: Payer: Self-pay | Admitting: Cardiology

## 2018-08-15 ENCOUNTER — Telehealth: Payer: Self-pay

## 2018-08-15 NOTE — Telephone Encounter (Signed)
Spoke with pt due to COVID19 precautions. Pt has been screened and has not travel nor is having any symptoms. Pt states she feels that she can reschedule her appt. Pt's appt is reschedule for 09/26/18 with Cecilie Kicks, NP at 10 am. I made pt aware that if anything changes our office will give her a call or if anything changes on her end to call our office. Pt verbalized understanding and thanked me for the call.

## 2018-08-21 ENCOUNTER — Ambulatory Visit: Payer: 59 | Admitting: Cardiology

## 2018-09-24 ENCOUNTER — Telehealth: Payer: Self-pay | Admitting: Cardiology

## 2018-09-24 NOTE — Telephone Encounter (Signed)
New Message   Patient states she doesn't have a smart phone, and computer isn't hooked up but she can do a televisit.  Please call patient to explain virtual visit.

## 2018-09-25 NOTE — Telephone Encounter (Signed)
Returned pts call. She doesn't have access to smart phone / computer, but has agreed to virtual visit via phone.     Virtual Visit Pre-Appointment Phone Call  "(Name), I am calling you today to discuss your upcoming appointment. We are currently trying to limit exposure to the virus that causes COVID-19 by seeing patients at home rather than in the office."  1. "What is the BEST phone number to call the day of the visit?" - include this in appointment notes  2. "Do you have or have access to (through a family member/friend) a smartphone with video capability that we can use for your visit?" a. If yes - list this number in appt notes as "cell" (if different from BEST phone #) and list the appointment type as a VIDEO visit in appointment notes b. If no - list the appointment type as a PHONE visit in appointment notes  3. Confirm consent - "In the setting of the current Covid19 crisis, you are scheduled for a (phone or video) visit with your provider on (date) at (time).  Just as we do with many in-office visits, in order for you to participate in this visit, we must obtain consent.  If you'd like, I can send this to your mychart (if signed up) or email for you to review.  Otherwise, I can obtain your verbal consent now.  All virtual visits are billed to your insurance company just like a normal visit would be.  By agreeing to a virtual visit, we'd like you to understand that the technology does not allow for your provider to perform an examination, and thus may limit your provider's ability to fully assess your condition. If your provider identifies any concerns that need to be evaluated in person, we will make arrangements to do so.  Finally, though the technology is pretty good, we cannot assure that it will always work on either your or our end, and in the setting of a video visit, we may have to convert it to a phone-only visit.  In either situation, we cannot ensure that we have a secure  connection.  Are you willing to proceed?" STAFF: Did the patient verbally acknowledge consent to telehealth visit? Document YES/NO here: YES  4. Advise patient to be prepared - "Two hours prior to your appointment, go ahead and check your blood pressure, pulse, oxygen saturation, and your weight (if you have the equipment to check those) and write them all down. When your visit starts, your provider will ask you for this information. If you have an Apple Watch or Kardia device, please plan to have heart rate information ready on the day of your appointment. Please have a pen and paper handy nearby the day of the visit as well."  5. Give patient instructions for MyChart download to smartphone OR Doximity/Doxy.me as below if video visit (depending on what platform provider is using)  6. Inform patient they will receive a phone call 15 minutes prior to their appointment time (may be from unknown caller ID) so they should be prepared to answer    Kayla Booth has been deemed a candidate for a follow-up tele-health visit to limit community exposure during the Covid-19 pandemic. I spoke with the patient via phone to ensure availability of phone/video source, confirm preferred email & phone number, and discuss instructions and expectations.  I reminded Kayla Booth to be prepared with any vital sign and/or heart rhythm information that could potentially be  obtained via home monitoring, at the time of her visit. I reminded Kayla Booth to expect a phone call prior to her visit.  [name], [place] 09/25/2018 8:34 AM   INSTRUCTIONS FOR DOWNLOADING THE MYCHART APP TO SMARTPHONE  - The patient must first make sure to have activated MyChart and know their login information - If Apple, go to [employer] and type in MyChart in the search bar and download the app. If Android, ask patient to go to [employer] and type in [place] in the search bar and download the app. The app is  free but as with any other app downloads, their phone may require them to verify saved payment information or Apple/Android password.  - The patient will need to then log into the app with their MyChart username and password, and select [facility] as their healthcare provider to link the account. When it is time for your visit, go to the MyChart app, find appointments, and click Begin Video Visit. Be sure to Select Allow for your device to access the Microphone and Camera for your visit. You will then be connected, and your provider will be with you shortly.  **If they have any issues connecting, or need assistance please contact MyChart service desk [phone])**  **If using a computer, in order to ensure the best quality for their visit they will need to use either of the following Internet Browsers: [employer], or Google Chrome**  IF USING DOXIMITY or DOXY.ME - The patient will receive a link just prior to their visit by text.     FULL LENGTH CONSENT FOR TELE-HEALTH VISIT   I hereby voluntarily request, consent and authorize [facility] and its employed or contracted physicians, physician assistants, nurse practitioners or other licensed health care professionals (the Practitioner), to provide me with telemedicine health care services (the "Services") as deemed necessary by the treating Practitioner. I acknowledge and consent to receive the Services by the Practitioner via telemedicine. I understand that the telemedicine visit will involve communicating with the Practitioner through live audiovisual communication technology and the disclosure of certain medical information by electronic transmission. I acknowledge that I have been given the opportunity to request an in-person assessment or other available alternative prior to the telemedicine visit and am voluntarily participating in the telemedicine visit.  I understand that I have the right to withhold or withdraw my  consent to the use of telemedicine in the course of my care at any time, without affecting my right to future care or treatment, and that the Practitioner or I may terminate the telemedicine visit at any time. I understand that I have the right to inspect all information obtained and/or recorded in the course of the telemedicine visit and may receive copies of available information for a reasonable fee.  I understand that some of the potential risks of receiving the Services via telemedicine include:  Marland Kitchen Delay or interruption in medical evaluation due to technological equipment failure or disruption; . Information transmitted may not be sufficient (e.g. poor resolution of images) to allow for appropriate medical decision making by the Practitioner; and/or  . In rare instances, security protocols could fail, causing a breach of personal health information.  Furthermore, I acknowledge that it is my responsibility to provide information about my medical history, conditions and care that is complete and accurate to the best of my ability. I acknowledge that Practitioner's advice, recommendations, and/or decision may be based on factors not within their control, such  as incomplete or inaccurate data provided by me or distortions of diagnostic images or specimens that may result from electronic transmissions. I understand that the practice of medicine is not an exact science and that Practitioner makes no warranties or guarantees regarding treatment outcomes. I acknowledge that I will receive a copy of this consent concurrently upon execution via email to the email address I last provided but may also request a printed copy by calling the office of [facility].    I understand that my insurance will be billed for this visit.   I have read or had this consent read to me. . I understand the contents of this consent, which adequately explains the benefits and risks of the Services being provided via telemedicine.   . I have been provided ample opportunity to ask questions regarding this consent and the Services and have had my questions answered to my satisfaction. . I give my informed consent for the services to be provided through the use of telemedicine in my medical care  By participating in this telemedicine visit I agree to the above.

## 2018-09-25 NOTE — Progress Notes (Signed)
Virtual Visit via Telephone Note   This visit type was conducted due to national recommendations for restrictions regarding the COVID-19 Pandemic (e.g. social distancing) in an effort to limit this patient's exposure and mitigate transmission in our community.  Due to her co-morbid illnesses, this patient is at least at moderate risk for complications without adequate follow up.  This format is felt to be most appropriate for this patient at this time.  The patient did not have access to video technology/had technical difficulties with video requiring transitioning to audio format only (telephone).  All issues noted in this document were discussed and addressed.  No physical exam could be performed with this format.  Please refer to the patient's chart for her  consent to telehealth for [facility].  Pt coiuld not do video conference.   Evaluation Performed:  Follow-up visit for a fib.  Date:  09/26/2018   ID:  Kayla Booth, Kayla Booth [birth year], MRN [id number]  Patient Location: Home Provider Location: Office  PCP:  Helane Rima, MD  Cardiologist:  Candee Furbish, MD  Electrophysiologist:  None   Chief Complaint:  Atrial fib and rt foot pain.  History of Present Illness:    Kayla Booth is a 78 y.o. female with hx of thrombocytosis, pneumonia with hospitalization on 12/15/2017 with atrial fibrillation on Eliquis here for follow-up.  Echocardiogram showed low normal ejection fraction of 50 to 55% with mild AR.  She had elevated LFTs during the time.  She believes that atrial fibrillation was triggered by pneumonia.  She was asymptomatic.   On Eliquis.  She does not sense her atrial fibrillation.   We tried an attempt at cardioversion but this was unsuccessful after several shocks.  We will continue with rate control.  No chest pain fevers chills nausea vomiting syncope bleeding.  Last seen by Dr. Marlou Porch 02/01/18.    Today she has no chest pain or SOB.  Again unaware of her a fib.   Continues on eliquis BID and denies any blood in her stool or urine.  Her diet is healthy and she does her grocery shopping and does wear a mask.  No cough, colds or fevers. she does have some swelling in her Rt forefoot.  No known injury.  Mild swelling she can still bend her toes and they do not look like sausages.  No discoloration.   Her PCP is no longer in practice here in [place].    The patient does not have symptoms concerning for COVID-19 infection (fever, chills, cough, or new shortness of breath).    Past Medical History:  Diagnosis Date  . Abnormal LFTs 12/16/2017  . Acute encephalopathy 12/16/2017  . CAP (community acquired pneumonia) 12/16/2017  . 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 HTN (hypertension)   . Hyperlipidemia   . New onset atrial fibrillation ([place]) 12/16/2017   Past Surgical History:  Procedure Laterality Date  . CARDIOVERSION N/A 01/19/2018   Procedure: CARDIOVERSION;  Surgeon: Josue Hector, MD;  Location: [facility] ENDOSCOPY;  Service: Cardiovascular;  Laterality: N/A;  . SPLENECTOMY       Current Meds  Medication Sig  . acetaminophen (TYLENOL) 500 MG tablet Take 500 mg by mouth every 6 (six) hours as needed for pain.  Marland Kitchen apixaban (ELIQUIS) 5 MG TABS tablet Take 1 tablet (5 mg total) by mouth 2 (two) times daily.  . cholecalciferol (VITAMIN D) 400 units TABS tablet Take 400 Units by mouth daily.  . metoprolol tartrate (LOPRESSOR) 25 MG tablet TAKE  1 TABLET BY MOUTH TWICE DAILY  . Omega-3 Fatty Acids (OMEGA-3 PO) Take 520 mg by mouth daily.  . vitamin B-12 (CYANOCOBALAMIN) 1000 MCG tablet Take 1,000 mcg by mouth daily.     Allergies:   Aspirin   Social History   Tobacco Use  . Smoking status: Never Smoker  . Smokeless tobacco: Never Used  Substance Use Topics  . Alcohol use: No  . Drug use: No     Family Hx: The patient's family history includes Diabetes in her mother; Heart failure in an other family member; Stroke in her mother.  ROS:   Please see  the history of present illness.    General:no colds or fevers, no weight changes Skin:no rashes or ulcers HEENT:no blurred vision, no congestion CV:see HPI PUL:see HPI GI:no diarrhea constipation or melena, no indigestion GU:no hematuria, no dysuria MS:no joint pain, no claudication, + rt foot pain Neuro:no syncope, no lightheadedness Endo:no diabetes, no thyroid disease  All other systems reviewed and are negative.   Prior CV studies:   The following studies were reviewed today:  Echo 12/17/17  Study Conclusions  - Left ventricle: The cavity size was normal. Systolic function was   normal. The estimated ejection fraction was in the range of 50%   to 55%. Wall motion was normal; there were no regional wall   motion abnormalities. - Aortic valve: There was mild regurgitation. - Mitral valve: There was trivial regurgitation. - Right ventricle: Systolic function was normal. - Atrial septum: No defect or patent foramen ovale was identified. - Tricuspid valve: There was trivial regurgitation. Peak RV-RA   gradient (S): 19 mm Hg. - Pulmonic valve: There was no significant regurgitation. - Pulmonary arteries: Systolic pressure was within the normal   range. PA peak pressure: 22 mm Hg (S).  Impressions:  - Atrial fibrillation throughout study. Normal LV function. Mild   AR, trivial MR/TR.  Labs/Other Tests and Data Reviewed:    EKG:  An ECG dated 01/15/18 was personally reviewed today and demonstrated:  a fib rate controlled at 78, no changes from Prior EKG 12/16/17   Recent Labs: 12/16/2017: Magnesium 1.7; TSH 1.234 01/04/2018: ALT 16; BUN 9; Creatinine 0.87; Hemoglobin 14.3; Platelets 576; Potassium 4.2; Sodium 141   Recent Lipid Panel No results found for: CHOL, TRIG, HDL, CHOLHDL, LDLCALC, LDLDIRECT  Wt Readings from Last 3 Encounters:  09/26/18 152 lb (68.9 kg)  02/01/18 144 lb (65.3 kg)  01/23/18 145 lb (65.8 kg)     Objective:    Vital Signs:  Ht 5\' 2"  (1.575 m)    Wt 152 lb (68.9 kg)   BMI 27.80 kg/m    VITAL SIGNS:  reviewed pt unable to take her BP or pulse Neuro alert and oriented X 3, follows commands and answers questions approp. Lungs: can completes sentences without SOB or wheezes Psych:  Pleasant affect.   ASSESSMENT & PLAN:    1. Permanent atrial fib.  Pt without symptoms or rapid HR -she is unaware of a fib- she has had failed DCCV in past so has remained in a fib.  Rate controlled on metoprolol.   Follow up with Dr. Marlou Porch in 6 months. 2. Anticoagulation on Eliquis has not missed any doses, no bleeding.  3. Rt foot pain, very mild swelling, no known injury and no discoloration.  She will use ice and heat alternating 3 times a day and if not improved in next 3-4 days she will go to urgent care.  Her PCP no  longer practices in town.    4. HTN unable to take herself.  Mildly elevated on last PCP visit.   COVID-19 Education: The signs and symptoms of COVID-19 were discussed with the patient and how to seek care for testing (follow up with PCP or arrange E-visit).  The importance of social distancing was discussed today.  Time:   Today, I have spent 15 minutes with the patient with telehealth technology discussing the above problems.     Medication Adjustments/Labs and Tests Ordered: Current medicines are reviewed at length with the patient today.  Concerns regarding medicines are outlined above.   Tests Ordered: No orders of the defined types were placed in this encounter.   Medication Changes: No orders of the defined types were placed in this encounter.   Disposition:  Follow up in 6 month(s)  Signed, Cecilie Kicks, NP  09/26/2018 10:32 AM    [facility]

## 2018-09-26 ENCOUNTER — Other Ambulatory Visit: Payer: Self-pay

## 2018-09-26 ENCOUNTER — Encounter: Payer: Self-pay | Admitting: Cardiology

## 2018-09-26 ENCOUNTER — Telehealth (INDEPENDENT_AMBULATORY_CARE_PROVIDER_SITE_OTHER): Payer: Medicare HMO | Admitting: Cardiology

## 2018-09-26 VITALS — Ht 62.0 in | Wt 152.0 lb

## 2018-09-26 DIAGNOSIS — I4821 Permanent atrial fibrillation: Secondary | ICD-10-CM

## 2018-09-26 DIAGNOSIS — Z7901 Long term (current) use of anticoagulants: Secondary | ICD-10-CM

## 2018-09-26 DIAGNOSIS — I10 Essential (primary) hypertension: Secondary | ICD-10-CM

## 2018-09-26 DIAGNOSIS — M79671 Pain in right foot: Secondary | ICD-10-CM

## 2018-09-26 NOTE — Patient Instructions (Signed)
Medication Instructions:  .isntcu MAKE SURE YOU STAY ON THE ELIQUIS  If you need a refill on your cardiac medications before your next appointment, please call your pharmacy.   Lab work: None ordered  If you have labs (blood work) drawn today and your tests are completely normal, you will receive your results only by: Marland Kitchen MyChart Message (if you have MyChart) OR . A paper copy in the mail If you have any lab test that is abnormal or we need to change your treatment, we will call you to review the results.  Testing/Procedures: None ordered  Follow-Up: At [facility], you and your health needs are our priority.  As part of our continuing mission to provide you with exceptional heart care, we have created designated Provider Care Teams.  These Care Teams include your primary Cardiologist (physician) and Advanced Practice Providers (APPs -  Physician Assistants and Nurse Practitioners) who all work together to provide you with the care you need, when you need it. You will need a follow up appointment in 6 months.  Please call our office 2 months in advance to schedule this appointment.  You may see Candee Furbish, MD or one of the following Advanced Practice Providers on your designated Care Team:   Truitt Merle, NP Cecilie Kicks, NP . Kathyrn Drown, NP  Any Other Special Instructions Will Be Listed Below (If Applicable). Alternate heat and ice for 10 min three times per day. If no improvement in next 3-4 days then you should go to an urgent care to be evaluated. I guess the one by [facility] - since your PCP is no longer in practice

## 2019-01-09 ENCOUNTER — Other Ambulatory Visit: Payer: Self-pay | Admitting: Cardiology

## 2019-01-09 NOTE — Telephone Encounter (Signed)
Last OV 09/26/2018 77.31kg 78 years old Scr 1.01 on 11/14/2018 per [facility] Eliquis 5mg  BID sent to pharmacy

## 2019-01-21 ENCOUNTER — Other Ambulatory Visit: Payer: Self-pay | Admitting: Cardiology

## 2019-02-12 NOTE — Progress Notes (Signed)
HEMATOLOGY/ONCOLOGY CONSULTATION NOTE  Date of Service: 02/13/2019  Patient Care Team: Helane Rima, MD as PCP - General (Family Medicine) Jerline Pain, MD as PCP - Cardiology (Cardiology)  CHIEF COMPLAINTS/PURPOSE OF CONSULTATION:  Thrombocytosis  HISTORY OF PRESENTING ILLNESS:   Kayla Booth is a wonderful 78 y.o. female who has been referred to [place] by Dr. Helane Rima for evaluation and management of Thrombocytosis. The pt reports that she is doing well overall.   The pt notes that she has recovered well from her recent Pneumonia, being discharged on 12/19/17. She has finished her antibiotic course. She denies having frequent infections.   The pt notes that she has maintained follow up with her PCP with annual visits but has not been aware of her increased platelet counts until recently.  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 She is taking Eliquis for Afib.   The pt reports that she has never needed a blood transfusion, denies concerns for anemia, and has not taken folic acid despite a splenectomy.   She will be seeing Dr. Paulita Fujita in GI for her recently elevated Alkaline Phosphatase.   She notes that she takes Vitamin B12 and Vitamin D. She does not use a walker or a cane.   Most recent lab results (12/18/17) of CBC is as follows: all values are WNL except for WBC at 16.1k, RBC at 3.86, PLT at 779k., Immature grans abs at 200, ANC at 11.2k, Monocytes abs at 1.2k.   On review of systems, pt reports recently resolved Pneumonia, good energy levels, and denies leg swelling, CP, SOB, frequent infections, abdominal pains, changes in bowel habits, urinary discomfort or difficulty, vision changes, and any other symptoms.   On PMHx the pt reports hereditary spherocytosis with splenectomy at age 88, Osteoporosis, Afib.  INTERVAL HISTORY:  Kayla Booth is a wonderful 78 y.o. female who is here today for evaluation and  management of thrombocytosis. The patient's last visit with [place] was on 01/23/2018. The pt reports that she is doing well overall.  The pt reports that she has been doing good but she feels that she is slowing down. No concerns with blood clots but she reports significant bruising. Pt is still taking her Eliquis.   Lab results today (02/13/19) of CBC w/diff is as follows: all values are WNL except for MCHC at 36.1, Platelets at 534K.   On review of systems, pt reports swollen right foot, bruising and denies SOB, fever, chills, night sweats, abdominal pain, back pain, leg swelling, new bone pain and any other symptoms.   MEDICAL HISTORY:  Past Medical History:  Diagnosis Date  . Abnormal LFTs 12/16/2017  . Acute encephalopathy 12/16/2017  . CAP (community acquired pneumonia) 12/16/2017  . Hereditary spherocytosis ([place])   . HTN (hypertension)   . Hyperlipidemia   . New onset atrial fibrillation ([place]) 12/16/2017    SURGICAL HISTORY: Past Surgical History:  Procedure Laterality Date  . CARDIOVERSION N/A 01/19/2018   Procedure: CARDIOVERSION;  Surgeon: Josue Hector, MD;  Location: [facility] ENDOSCOPY;  Service: Cardiovascular;  Laterality: N/A;  . SPLENECTOMY      SOCIAL HISTORY: Social History   Socioeconomic History  . Marital status: Divorced    Spouse name: Not on file  . Number of children: Not on file  . Years of education: Not on file  . Highest education level: Not on file  Occupational History  . Not on file  Social  Needs  . Financial resource strain: Not on file  . Food insecurity    Worry: Not on file    Inability: Not on file  . Transportation needs    Medical: Not on file    Non-medical: Not on file  Tobacco Use  . Smoking status: Never Smoker  . Smokeless tobacco: Never Used  Substance and Sexual Activity  . Alcohol use: No  . Drug use: No  . Sexual activity: Not on file  Lifestyle  . Physical activity    Days per week: Not on file    Minutes per session: Not on  file  . Stress: Not on file  Relationships  . Social Herbalist on phone: Not on file    Gets together: Not on file    Attends religious service: Not on file    Active member of club or organization: Not on file    Attends meetings of clubs or organizations: Not on file    Relationship status: Not on file  . Intimate partner violence    Fear of current or ex partner: Not on file    Emotionally abused: Not on file    Physically abused: Not on file    Forced sexual activity: Not on file  Other Topics Concern  . Not on file  Social History Narrative  . Not on file    FAMILY HISTORY: Family History  Problem Relation Age of Onset  . Heart failure Other   . Diabetes Mother   . Stroke Mother     ALLERGIES:  is allergic to aspirin.  MEDICATIONS:  Current Outpatient Medications  Medication Sig Dispense Refill  . acetaminophen (TYLENOL) 500 MG tablet Take 500 mg by mouth every 6 (six) hours as needed for pain.    . cholecalciferol (VITAMIN D) 400 units TABS tablet Take 400 Units by mouth daily.    Marland Kitchen ELIQUIS 5 MG TABS tablet TAKE 1 TABLET BY MOUTH TWICE DAILY 60 tablet 5  . metoprolol tartrate (LOPRESSOR) 25 MG tablet TAKE 1 TABLET BY MOUTH TWICE DAILY 180 tablet 2  . Omega-3 Fatty Acids (OMEGA-3 PO) Take 520 mg by mouth daily.    . vitamin B-12 (CYANOCOBALAMIN) 1000 MCG tablet Take 1,000 mcg by mouth daily.     No current facility-administered medications for this visit.     REVIEW OF SYSTEMS:    A 10+ POINT REVIEW OF SYSTEMS WAS OBTAINED including neurology, dermatology, psychiatry, cardiac, respiratory, lymph, extremities, GI, GU, Musculoskeletal, constitutional, breasts, reproductive, HEENT.  All pertinent positives are noted in the HPI.  All others are negative.   PHYSICAL EXAMINATION:  . Vitals:   02/13/19 1342  BP: (!) 157/90  Pulse: 80  Resp: 18  Temp: 98.3 F (36.8 C)  SpO2: 99%   Filed Weights   02/13/19 1342  Weight: 152 lb 1.6 oz (69 kg)    .Body mass index is 27.82 kg/m.  GENERAL:alert, in no acute distress and comfortable SKIN: no acute rashes, no significant lesions EYES: conjunctiva are pink and non-injected, sclera anicteric OROPHARYNX: MMM, no exudates, no oropharyngeal erythema or ulceration NECK: supple, no JVD LYMPH:  no palpable lymphadenopathy in the cervical, axillary or inguinal regions LUNGS: clear to auscultation b/l with normal respiratory effort HEART: regular rate & rhythm ABDOMEN:  normoactive bowel sounds , non tender, not distended. No palpable hepatosplenomegaly.  Extremity: no pedal edema PSYCH: alert & oriented x 3 with fluent speech NEURO: no focal motor/sensory deficits  LABORATORY DATA:  I have reviewed the data as listed  . CBC Latest Ref Rng & Units 02/13/2019 01/04/2018 01/04/2018  WBC 4.0 - 10.5 K/uL 8.6 7.7 -  Hemoglobin 12.0 - 15.0 g/dL 14.5 14.3 -  Hematocrit 36.0 - 46.0 % 40.2 40.0 40.9  Platelets 150 - 400 K/uL 534(H) 576(H) -    . CMP Latest Ref Rng & Units 01/04/2018 12/19/2017 12/18/2017  Glucose 70 - 99 mg/dL 86 100(H) 107(H)  BUN 8 - 23 mg/dL 9 <5(L) <5(L)  Creatinine 0.44 - 1.00 mg/dL 0.87 0.71 0.84  Sodium 135 - 145 mmol/L 141 141 142  Potassium 3.5 - 5.1 mmol/L 4.2 4.0 3.8  Chloride 98 - 111 mmol/L 104 105 102  CO2 22 - 32 mmol/L 26 26 29   Calcium 8.9 - 10.3 mg/dL 9.4 8.7(L) 8.7(L)  Total Protein 6.5 - 8.1 g/dL 7.5 6.7 6.4(L)  Total Bilirubin 0.3 - 1.2 mg/dL 1.6(H) 0.9 1.0  Alkaline Phos 38 - 126 U/L 167(H) 399(H) 450(H)  AST 15 - 41 U/L 27 41 53(H)  ALT 0 - 44 U/L 16 41 49(H)     RADIOGRAPHIC STUDIES: I have personally reviewed the radiological images as listed and agreed with the findings in the report. No results found.  ASSESSMENT & PLAN:   78 y.o. female with  1. 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 Essential thrombocytosis ruled out with neg testing for clonal mutations as noted above.  2. Hereditary Spherocytosis Pt had a splenectomy at  age 72, which was 68 years ago Stable hgb.  PLAN -Discussed pt labwork today, 02/13/19; all values are WNL except for MCHC at 36.1, Platelets at 534K.  -Advised pt that if she gets surgery or has an infection her platelets will likely spike higher than normal but that it's typical for pts who do not have a spleen -Continue B-complex - to ensure support for accelerated hematopoeisis due to hereditary spherocytosis. -Pt is on Eliquis for Afib which is protective for her thrombocytosis -If pt has stroke-like symptoms, CP and SOB, vision changes, or new painful/swollen LEs, she understands that she will present to the ED  -no evidence of clonal thrombocytosis needing marrow suppressive platelet suppressive treatments. -Will see back as needed   3) Patient Active Problem List   Diagnosis Date Noted  . HTN (hypertension) 12/16/2017  . CAP (community acquired pneumonia) 12/16/2017  . Paroxysmal A-fib ([place]) 12/16/2017  . Acute encephalopathy 12/16/2017  . Abnormal LFTs 12/16/2017  -continue f/u with PCP  FOLLOW UP: RTC with Dr Irene Limbo as needed if any new questions arise   The total time spent in the appt was 15 minutes and more than 50% was on counseling and direct patient cares.  All of the patient's questions were answered with apparent satisfaction. The patient knows to call the clinic with any problems, questions or concerns.   Sullivan Lone MD [place] AAHIVMS [facility] Hematology/Oncology Physician [facility]  (Office):       [phone] (Work cell):  [phone] (Fax):           [phone]  02/13/2019 1:34 PM  I, Yevette Edwards, am acting as a scribe for Dr. Sullivan Lone.   .I have reviewed the above documentation for accuracy and completeness, and I agree with the above. Brunetta Genera MD

## 2019-02-13 ENCOUNTER — Inpatient Hospital Stay: Payer: Medicare HMO | Attending: Hematology

## 2019-02-13 ENCOUNTER — Inpatient Hospital Stay (HOSPITAL_BASED_OUTPATIENT_CLINIC_OR_DEPARTMENT_OTHER): Payer: Medicare HMO | Admitting: Hematology

## 2019-02-13 ENCOUNTER — Other Ambulatory Visit: Payer: Self-pay

## 2019-02-13 VITALS — BP 157/90 | HR 80 | Temp 98.3°F | Resp 18 | Ht 62.0 in | Wt 152.1 lb

## 2019-02-13 DIAGNOSIS — D75839 Thrombocytosis, unspecified: Secondary | ICD-10-CM

## 2019-02-13 DIAGNOSIS — R945 Abnormal results of liver function studies: Secondary | ICD-10-CM | POA: Diagnosis not present

## 2019-02-13 DIAGNOSIS — D7589 Other specified diseases of blood and blood-forming organs: Secondary | ICD-10-CM | POA: Diagnosis not present

## 2019-02-13 DIAGNOSIS — D473 Essential (hemorrhagic) thrombocythemia: Secondary | ICD-10-CM

## 2019-02-13 DIAGNOSIS — Z79899 Other long term (current) drug therapy: Secondary | ICD-10-CM | POA: Insufficient documentation

## 2019-02-13 DIAGNOSIS — I480 Paroxysmal atrial fibrillation: Secondary | ICD-10-CM | POA: Insufficient documentation

## 2019-02-13 DIAGNOSIS — I10 Essential (primary) hypertension: Secondary | ICD-10-CM | POA: Insufficient documentation

## 2019-02-13 DIAGNOSIS — D580 Hereditary spherocytosis: Secondary | ICD-10-CM | POA: Diagnosis not present

## 2019-02-13 DIAGNOSIS — Z9081 Acquired absence of spleen: Secondary | ICD-10-CM | POA: Diagnosis not present

## 2019-02-13 DIAGNOSIS — Z7901 Long term (current) use of anticoagulants: Secondary | ICD-10-CM | POA: Insufficient documentation

## 2019-02-13 LAB — CBC WITH DIFFERENTIAL/PLATELET
Abs Immature Granulocytes: 0.02 10*3/uL (ref 0.00–0.07)
Basophils Absolute: 0.1 10*3/uL (ref 0.0–0.1)
Basophils Relative: 1 %
Eosinophils Absolute: 0.3 10*3/uL (ref 0.0–0.5)
Eosinophils Relative: 4 %
HCT: 40.2 % (ref 36.0–46.0)
Hemoglobin: 14.5 g/dL (ref 12.0–15.0)
Immature Granulocytes: 0 %
Lymphocytes Relative: 34 %
Lymphs Abs: 2.9 10*3/uL (ref 0.7–4.0)
MCH: 34 pg (ref 26.0–34.0)
MCHC: 36.1 g/dL — ABNORMAL HIGH (ref 30.0–36.0)
MCV: 94.4 fL (ref 80.0–100.0)
Monocytes Absolute: 0.8 10*3/uL (ref 0.1–1.0)
Monocytes Relative: 9 %
Neutro Abs: 4.6 10*3/uL (ref 1.7–7.7)
Neutrophils Relative %: 52 %
Platelets: 534 10*3/uL — ABNORMAL HIGH (ref 150–400)
RBC: 4.26 MIL/uL (ref 3.87–5.11)
RDW: 11.6 % (ref 11.5–15.5)
WBC: 8.6 10*3/uL (ref 4.0–10.5)
nRBC: 0 % (ref 0.0–0.2)

## 2019-02-14 ENCOUNTER — Telehealth: Payer: Self-pay | Admitting: Hematology

## 2019-02-14 NOTE — Telephone Encounter (Signed)
Per 9/16 los RTC with Dr Irene Limbo as needed if any new questions arise

## 2019-03-06 ENCOUNTER — Ambulatory Visit: Payer: Medicare HMO | Admitting: Cardiology

## 2019-03-28 ENCOUNTER — Encounter: Payer: Self-pay | Admitting: Cardiology

## 2019-03-28 ENCOUNTER — Ambulatory Visit: Payer: Medicare HMO | Admitting: Cardiology

## 2019-03-28 ENCOUNTER — Other Ambulatory Visit: Payer: Self-pay

## 2019-03-28 VITALS — BP 130/80 | HR 87 | Ht 62.0 in | Wt 155.0 lb

## 2019-03-28 DIAGNOSIS — M79671 Pain in right foot: Secondary | ICD-10-CM

## 2019-03-28 DIAGNOSIS — I4821 Permanent atrial fibrillation: Secondary | ICD-10-CM | POA: Diagnosis not present

## 2019-03-28 DIAGNOSIS — Z7901 Long term (current) use of anticoagulants: Secondary | ICD-10-CM

## 2019-03-28 NOTE — Progress Notes (Signed)
Cardiology Office Note:    Date:  03/28/2019   ID:  Adlyn, [place]orno 03/06/1941, MRN [id number]  PCP:  Helane Rima, MD  Cardiologist:  Candee Furbish, MD   Referring MD: Helane Rima, MD     History of Present Illness:    Kayla Booth is a 78 y.o. female with a hx of thrombocytosis, pneumonia with hospitalization on 12/15/2017 with atrial fibrillation on Eliquis here for follow-up.  Echocardiogram showed low normal ejection fraction of 50 to 55% with mild AR.  She had elevated LFTs during the time.  She believes that atrial fibrillation was triggered by pneumonia.  She was asymptomatic.   On Eliquis.  Overall she has been feeling quite well.  She does not sense her atrial fibrillation.   We tried an attempt at cardioversion but this was unsuccessful after several shocks.  We will continue with rate control.  No chest pain fevers chills nausea vomiting syncope bleeding  03/28/2019-here for the follow-up of atrial fibrillation.  Overall doing well.  She has had some right foot swelling.  Has been told this is because of arthritis.  I tend to agree.  The remainder of her leg looks excellent.  She is not having any fevers chills nausea vomiting syncope bleeding.  Tolerating her medications.  In the Medicare donut hole.  Her Eliquis went from $45-$120.   Past Medical History:  Diagnosis Date  . Abnormal LFTs 12/16/2017  . Acute encephalopathy 12/16/2017  . CAP (community acquired pneumonia) 12/16/2017  . Hereditary spherocytosis ([place])   . HTN (hypertension)   . Hyperlipidemia   . New onset atrial fibrillation ([place]) 12/16/2017    Past Surgical History:  Procedure Laterality Date  . CARDIOVERSION N/A 01/19/2018   Procedure: CARDIOVERSION;  Surgeon: Josue Hector, MD;  Location: [facility] ENDOSCOPY;  Service: Cardiovascular;  Laterality: N/A;  . SPLENECTOMY      Current Medications: Current Meds  Medication Sig  . acetaminophen (TYLENOL) 500 MG tablet Take 500 mg by mouth every 6  (six) hours as needed for pain.  . cholecalciferol (VITAMIN D) 400 units TABS tablet Take 400 Units by mouth daily.  Marland Kitchen ELIQUIS 5 MG TABS tablet TAKE 1 TABLET BY MOUTH TWICE DAILY  . metoprolol tartrate (LOPRESSOR) 25 MG tablet TAKE 1 TABLET BY MOUTH TWICE DAILY  . Omega-3 Fatty Acids (OMEGA-3 PO) Take 520 mg by mouth daily.  . vitamin B-12 (CYANOCOBALAMIN) 1000 MCG tablet Take 1,000 mcg by mouth daily.     Allergies:   Aspirin   Social History   Socioeconomic History  . Marital status: Divorced    Spouse name: Not on file  . Number of children: Not on file  . Years of education: Not on file  . Highest education level: Not on file  Occupational History  . Not on file  Social Needs  . Financial resource strain: Not on file  . Food insecurity    Worry: Not on file    Inability: Not on file  . Transportation needs    Medical: Not on file    Non-medical: Not on file  Tobacco Use  . Smoking status: Never Smoker  . Smokeless tobacco: Never Used  Substance and Sexual Activity  . Alcohol use: No  . Drug use: No  . Sexual activity: Not on file  Lifestyle  . Physical activity    Days per week: Not on file    Minutes per session: Not on file  . Stress: Not on file  Relationships  . Social Herbalist on phone: Not on file    Gets together: Not on file    Attends religious service: Not on file    Active member of club or organization: Not on file    Attends meetings of clubs or organizations: Not on file    Relationship status: Not on file  Other Topics Concern  . Not on file  Social History Narrative  . Not on file     Family History: The patient's family history includes Diabetes in her mother; Heart failure in an other family member; Stroke in her mother.  ROS:   Please see the history of present illness.     All other systems reviewed and are negative.  EKGs/Labs/Other Studies Reviewed:    The following studies were reviewed today: Hospital notes,  echocardiogram, lab work, oncology note personally reviewed  EKG:   01/15/2018-atrial fibrillation 80 with no other abnormality's.  Recent Labs: 02/13/2019: Hemoglobin 14.5; Platelets 534  Recent Lipid Panel No results found for: CHOL, TRIG, HDL, CHOLHDL, VLDL, LDLCALC, LDLDIRECT  Physical Exam:    VS:  BP 130/80   Pulse 87   Ht 5\' 2"  (1.575 m)   Wt 155 lb (70.3 kg)   SpO2 91%   BMI 28.35 kg/m     Wt Readings from Last 3 Encounters:  03/28/19 155 lb (70.3 kg)  02/13/19 152 lb 1.6 oz (69 kg)  09/26/18 152 lb (68.9 kg)     GEN: Well nourished, well developed, in no acute distress  HEENT: normal  Neck: no JVD, carotid bruits, or masses Cardiac: IRRR; no murmurs, rubs, or gallops,no edema  Respiratory:  clear to auscultation bilaterally, normal work of breathing GI: soft, nontender, nondistended, + BS MS: no deformity or atrophy  Skin: warm and dry, no rash Neuro:  Alert and Oriented x 3, Strength and sensation are intact Psych: euthymic mood, full affect    ASSESSMENT:    1. Permanent atrial fibrillation ([place])   2. Right foot pain   3. Chronic anticoagulation    PLAN:    In order of problems listed above:  Permanent atrial fibrillation - This was noted during hospitalization in July 2019 for pneumonia.  Eliquis was started on 12/18/2017.  She was asymptomatic.  Rate controlled.  We tried to give her an opportunity at normal sinus rhythm once again with cardioversion but this failed.  She states that she did have a son that cardioversion did not work on in the past.   -Continue with good rate control.  No changes made to medications today.  Metoprolol.  Eliquis. -Stable no changes.  Right pedal edema -Consider compression stockings/socks.  Does appear to be arthritic related.  Dependent edema may be playing a role as well.  Not severe today.  1 year follow-up.   Medication Adjustments/Labs and Tests Ordered: Current medicines are reviewed at length with the  patient today.  Concerns regarding medicines are outlined above.  Orders Placed This Encounter  Procedures  . EKG 12-Lead   No orders of the defined types were placed in this encounter.   Patient Instructions  Medication Instructions:  The current medical regimen is effective;  continue present plan and medications.  *If you need a refill on your cardiac medications before your next appointment, please call your pharmacy*  Follow-Up: At [facility], you and your health needs are our priority.  As part of our continuing mission to provide you with exceptional heart care, we  have created designated Provider Care Teams.  These Care Teams include your primary Cardiologist (physician) and Advanced Practice Providers (APPs -  Physician Assistants and Nurse Practitioners) who all work together to provide you with the care you need, when you need it.  Your next appointment:   12 months  The format for your next appointment:   In Person  Provider:   You may see Candee Furbish, MD or one of the following Advanced Practice Providers on your designated Care Team:    Truitt Merle, NP  Cecilie Kicks, NP  Kathyrn Drown, NP   Thank you for choosing [facility]!!         Signed, Candee Furbish, MD  03/28/2019 4:19 PM    [place]

## 2019-03-28 NOTE — Patient Instructions (Signed)
Medication Instructions:  The current medical regimen is effective;  continue present plan and medications.  *If you need a refill on your cardiac medications before your next appointment, please call your pharmacy*  Follow-Up: At [facility], you and your health needs are our priority.  As part of our continuing mission to provide you with exceptional heart care, we have created designated Provider Care Teams.  These Care Teams include your primary Cardiologist (physician) and Advanced Practice Providers (APPs -  Physician Assistants and Nurse Practitioners) who all work together to provide you with the care you need, when you need it.  Your next appointment:   12 months  The format for your next appointment:   In Person  Provider:   You may see Mark Skains, MD or one of the following Advanced Practice Providers on your designated Care Team:    Lori Gerhardt, NP  Laura Ingold, NP  Jill McDaniel, NP   Thank you for choosing [facility]!!      

## 2019-07-09 ENCOUNTER — Other Ambulatory Visit: Payer: Self-pay | Admitting: Cardiology

## 2019-09-04 ENCOUNTER — Ambulatory Visit
Admission: EM | Admit: 2019-09-04 | Discharge: 2019-09-04 | Disposition: A | Discharge status: Home / Self Care | Payer: Medicare HMO | Attending: Physician Assistant | Admitting: Physician Assistant

## 2019-09-04 DIAGNOSIS — M25551 Pain in right hip: Secondary | ICD-10-CM | POA: Diagnosis not present

## 2019-09-04 DIAGNOSIS — M545 Low back pain, unspecified: Secondary | ICD-10-CM

## 2019-09-04 DIAGNOSIS — W010XXA Fall on same level from slipping, tripping and stumbling without subsequent striking against object, initial encounter: Secondary | ICD-10-CM | POA: Diagnosis not present

## 2019-09-04 MED ORDER — DICLOFENAC SODIUM 1 % EX GEL: 4.0000 g | Freq: Four times a day (QID) | CUTANEOUS | 0 refills | Status: DC

## 2019-09-04 MED ORDER — ACETAMINOPHEN ER 650 MG PO TBCR: 650.0000 mg | EXTENDED_RELEASE_TABLET | Freq: Three times a day (TID) | ORAL | 0 refills | Status: AC | PRN

## 2019-09-04 MED ORDER — LIDOCAINE 5 % EX PTCH: 1.0000 | MEDICATED_PATCH | CUTANEOUS | 0 refills | Status: DC

## 2019-09-04 NOTE — ED Provider Notes (Signed)
[name]    CSN: [id number] Arrival date & time: 09/04/19  1713      History   Chief Complaint Chief Complaint  Patient presents with  . Fall    HPI Kayla Booth is a 79 y.o. female.   79 year old female with history of afib on eliquis, HLD, HTN, hereditary spherocytosis comes in for left low back pain after fall. She tripped over her feet and fell on to her right hip. She denies dizziness, weakness, chest pain, shortness of breath causing the fall. Denies head injury, loss of consciousness. Able to ambulate on own. Denies radiation of pain. Denies numbness/tingling, saddle anesthesia, loss of bladder or bowel control. Tylenol without relief.      Past Medical History:  Diagnosis Date  . Abnormal LFTs 12/16/2017  . Acute encephalopathy 12/16/2017  . CAP (community acquired pneumonia) 12/16/2017  . Hereditary spherocytosis ([place])   . HTN (hypertension)   . Hyperlipidemia   . New onset atrial fibrillation ([place]) 12/16/2017    Patient Active Problem List   Diagnosis Date Noted  . HTN (hypertension) 12/16/2017  . CAP (community acquired pneumonia) 12/16/2017  . Paroxysmal A-fib ([place]) 12/16/2017  . Acute encephalopathy 12/16/2017  . Abnormal LFTs 12/16/2017    Past Surgical History:  Procedure Laterality Date  . CARDIOVERSION N/A 01/19/2018   Procedure: CARDIOVERSION;  Surgeon: Josue Hector, MD;  Location: [facility] ENDOSCOPY;  Service: Cardiovascular;  Laterality: N/A;  . SPLENECTOMY      OB History   No obstetric history on file.      Home Medications    Prior to Admission medications   Medication Sig Start Date End Date Taking? Authorizing Provider  acetaminophen (TYLENOL 8 HOUR) 650 MG CR tablet Take 1 tablet (650 mg total) by mouth every 8 (eight) hours as needed for pain. 09/04/19   Tasia Catchings, Simon Aaberg V, PA-C  cholecalciferol (VITAMIN D) 400 units TABS tablet Take 400 Units by mouth daily.    [provider]  diclofenac Sodium (VOLTAREN) 1 % GEL Apply  4 g topically 4 (four) times daily. 09/04/19   Jordana Dugue V, PA-C  ELIQUIS 5 MG TABS tablet TAKE 1 TABLET BY MOUTH TWICE DAILY 07/09/19   Jerline Pain, MD  lidocaine (LIDODERM) 5 % Place 1 patch onto the skin daily. Remove & Discard patch within 12 hours or as directed by MD 09/04/19   Tasia Catchings, Wateen Varon V, PA-C  metoprolol tartrate (LOPRESSOR) 25 MG tablet TAKE 1 TABLET BY MOUTH TWICE DAILY 01/22/19   Jerline Pain, MD  Omega-3 Fatty Acids (OMEGA-3 PO) Take 520 mg by mouth daily.    [provider]  vitamin B-12 (CYANOCOBALAMIN) 1000 MCG tablet Take 1,000 mcg by mouth daily.    [provider]    Family History Family History  Problem Relation Age of Onset  . Heart failure Other   . Diabetes Mother   . Stroke Mother     Social History Social History   Tobacco Use  . Smoking status: Never Smoker  . Smokeless tobacco: Never Used  Substance Use Topics  . Alcohol use: No  . Drug use: No     Allergies   Aspirin   Review of Systems Review of Systems  Reason unable to perform ROS: See HPI as above.     Physical Exam Triage Vital Signs ED Triage Vitals [09/04/19 1811]  Enc Vitals Group     BP (!) 155/89     Pulse Rate 97  Resp 18     Temp 98 F (36.7 C)     Temp Source Oral     SpO2 95 %     Weight      Height      Head Circumference      Peak Flow      Pain Score 6     Pain Loc      Pain Edu?      Excl. in [place]?    No data found.  Updated Vital Signs BP (!) 155/89 (BP Location: Left Arm)   Pulse 97   Temp 98 F (36.7 C) (Oral)   Resp 18   SpO2 95%   Physical Exam Constitutional:      General: She is not in acute distress.    Appearance: Normal appearance. She is well-developed. She is not toxic-appearing or diaphoretic.  HENT:     Head: Normocephalic and atraumatic.  Eyes:     Conjunctiva/sclera: Conjunctivae normal.     Pupils: Pupils are equal, round, and reactive to light.  Cardiovascular:     Rate and Rhythm: Normal rate and regular rhythm.   Pulmonary:     Effort: Pulmonary effort is normal. No respiratory distress.     Comments: Speaking in full sentences without difficulty. LCTAB Musculoskeletal:     Cervical back: Normal range of motion and neck supple.     Comments: No swelling, erythema, warmth, contusion. No tenderness to spinous processes. Tenderness to palpation of right posterior and lateral hip, left lumbar back. Stable hips. Full passive ROM of the back/hips. Sensation intact. Negative straight leg raise.  Skin:    General: Skin is warm and dry.  Neurological:     Mental Status: She is alert and oriented to person, place, and time.      UC Treatments / Results  Labs (all labs ordered are listed, but only abnormal results are displayed) Labs Reviewed - No data to display  EKG   Radiology No results found.  Procedures Procedures (including critical care time)  Medications Ordered in UC Medications - No data to display  Initial Impression / Assessment and Plan / UC Course  I have reviewed the triage vital signs and the nursing notes.  Pertinent labs & imaging results that were available during my care of the patient were reviewed by me and considered in my medical decision making (see chart for details).    Exam without indications for xray at this time. Stable hips, patient able to ambulate on own. Given on eliquis, offered tramadol with first dose with son present, who lives with patient. Patient declined tramadol Rx, stating she will not take it. Will have patient continue tylenol and can try volatren gel/lidoderm for pain relief. Return precautions given. Patient expresses understanding and agrees to plan.  Final Clinical Impressions(s) / UC Diagnoses   Final diagnoses:  Acute left-sided low back pain without sciatica  Right hip pain    ED Prescriptions    Medication Sig Dispense Auth. Provider   acetaminophen (TYLENOL 8 HOUR) 650 MG CR tablet Take 1 tablet (650 mg total) by mouth every 8  (eight) hours as needed for pain. 21 tablet Niley Helbig V, PA-C   diclofenac Sodium (VOLTAREN) 1 % GEL Apply 4 g topically 4 (four) times daily. 150 g Dajsha Massaro V, PA-C   lidocaine (LIDODERM) 5 % Place 1 patch onto the skin daily. Remove & Discard patch within 12 hours or as directed by MD 30 patch Ok Edwards,  PA-C     PDMP not reviewed this encounter.   Ok Edwards, PA-C 09/04/19 2244

## 2019-09-04 NOTE — ED Triage Notes (Signed)
Pt states she tripped over her feet and fell in her kitchen on her rt hip around 4pm. Pt c/o lower back pain to lt side. Pt denies LOC, no bruising noted. Pt ambulatory with no problems. States took a tylenol with no relief.

## 2019-09-04 NOTE — Discharge Instructions (Signed)
No worries for bone injury. Tylenol 650mg  three times a day, ice compress to the area. You can try voltaren gel and lidoderm patches as well for the pain. This can take up to 3-4 weeks to completely resolve, but you should be feeling better each week. Follow up with PCP/orthopedics if symptoms worsen, changes for reevaluation. If experience numbness/tingling of the inner thighs, loss of bladder or bowel control, go to the emergency department for evaluation.

## 2019-11-18 ENCOUNTER — Other Ambulatory Visit: Payer: Self-pay | Admitting: Cardiology

## 2020-02-10 ENCOUNTER — Telehealth: Payer: Self-pay | Admitting: Pharmacist

## 2020-02-10 MED ORDER — APIXABAN 5 MG PO TABS: 5.0000 mg | ORAL_TABLET | Freq: Two times a day (BID) | ORAL | 1 refills | Status: DC

## 2020-02-10 NOTE — Telephone Encounter (Signed)
Received message from [facility] that Tier exception for Eliquis was approved to a Tier 1 medication due to COVID through 05/29/20. Refill sent to pharmacy.

## 2020-02-17 ENCOUNTER — Other Ambulatory Visit: Payer: Self-pay | Admitting: Cardiology

## 2020-02-20 NOTE — Progress Notes (Signed)
Cardiology Office Note    Date:  02/21/2020   ID:  Booth, Kayla Oct 17, 1940, MRN [id number]  PCP:  Pcp, No  Cardiologist:  Dr. Marlou Porch   Chief Complaint: yearly follow up  History of Present Illness:   Kayla Booth is a 79 y.o. female persistent atrial fibrillation, HTN and HLD seen for follow up.  Admitted 11/2017 for pneumonia.  Hospital course complicated by atrial fibrillation with rapid ventricular rate.  Echocardiogram showed normal LV function at 50 to 55% with mild AR.  Elevated LFT during admission.  Felt Kayla Booth was triggered by pneumonia.  Patient was asymptomatic. Failed cardioversion attempt multiple times.   Last seen by Dr. Marlou Porch 02/2019.  Here today for follow up.  Patient denies chest pain, shortness of breath, orthopnea, PND, syncope, lower extremity edema or melena.  Reports fatigue all the time and no energy.  Compliant with her medications.    Past Medical History:  Diagnosis Date  . Abnormal LFTs 12/16/2017  . Acute encephalopathy 12/16/2017  . CAP (community acquired pneumonia) 12/16/2017  . Hereditary spherocytosis ([place])   . HTN (hypertension)   . Hyperlipidemia   . New onset atrial fibrillation ([place]) 12/16/2017    Past Surgical History:  Procedure Laterality Date  . CARDIOVERSION N/A 01/19/2018   Procedure: CARDIOVERSION;  Surgeon: Josue Hector, MD;  Location: [facility] ENDOSCOPY;  Service: Cardiovascular;  Laterality: N/A;  . SPLENECTOMY      Current Medications: Prior to Admission medications   Medication Sig Start Date End Date Taking? Authorizing Provider  acetaminophen (TYLENOL 8 HOUR) 650 MG CR tablet Take 1 tablet (650 mg total) by mouth every 8 (eight) hours as needed for pain. 09/04/19   Tasia Catchings, Amy V, PA-C  apixaban (ELIQUIS) 5 MG TABS tablet Take 1 tablet (5 mg total) by mouth 2 (two) times daily. 02/10/20   Jerline Pain, MD  cholecalciferol (VITAMIN D) 400 units TABS tablet Take 400 Units by mouth daily.    [provider]   diclofenac Sodium (VOLTAREN) 1 % GEL Apply 4 g topically 4 (four) times daily. 09/04/19   Tasia Catchings, Amy V, PA-C  lidocaine (LIDODERM) 5 % Place 1 patch onto the skin daily. Remove & Discard patch within 12 hours or as directed by MD 09/04/19   Tasia Catchings, Amy V, PA-C  metoprolol tartrate (LOPRESSOR) 25 MG tablet TAKE 1 TABLET BY MOUTH TWICE A DAY 02/17/20   Jerline Pain, MD  Omega-3 Fatty Acids (OMEGA-3 PO) Take 520 mg by mouth daily.    [provider]  vitamin B-12 (CYANOCOBALAMIN) 1000 MCG tablet Take 1,000 mcg by mouth daily.    [provider]    Allergies:   Aspirin   Social History   Socioeconomic History  . Marital status: Divorced    Spouse name: Not on file  . Number of children: Not on file  . Years of education: Not on file  . Highest education level: Not on file  Occupational History  . Not on file  Tobacco Use  . Smoking status: Never Smoker  . Smokeless tobacco: Never Used  Vaping Use  . Vaping Use: Never used  Substance and Sexual Activity  . Alcohol use: No  . Drug use: No  . Sexual activity: Not on file  Other Topics Concern  . Not on file  Social History Narrative  . Not on file   Social Determinants of Health   Financial Resource Strain:   . Difficulty of Paying Living  Expenses: Not on file  Food Insecurity:   . Worried About Charity fundraiser in the Last Year: Not on file  . Ran Out of Food in the Last Year: Not on file  Transportation Needs:   . Lack of Transportation (Medical): Not on file  . Lack of Transportation (Non-Medical): Not on file  Physical Activity:   . Days of Exercise per Week: Not on file  . Minutes of Exercise per Session: Not on file  Stress:   . Feeling of Stress : Not on file  Social Connections:   . Frequency of Communication with Friends and Family: Not on file  . Frequency of Social Gatherings with Friends and Family: Not on file  . Attends Religious Services: Not on file  . Active Member of Clubs or Organizations:  Not on file  . Attends Archivist Meetings: Not on file  . Marital Status: Not on file     Family History:  The patient's family history includes Diabetes in her mother; Heart failure in an other family member; Stroke in her mother.   ROS:   Please see the history of present illness.    ROS All other systems reviewed and are negative.   PHYSICAL EXAM:   VS:  BP 130/80   Pulse 86   Ht 5\' 2"  (1.575 m)   Wt 149 lb (67.6 kg)   BMI 27.25 kg/m    GEN: Well nourished, well developed, in no acute distress  HEENT: normal  Neck: no JVD, carotid bruits, or masses Cardiac: RRR; no murmurs, rubs, or gallops,no edema  Respiratory:  clear to auscultation bilaterally, normal work of breathing GI: soft, nontender, nondistended, + BS MS: no deformity or atrophy  Skin: warm and dry, no rash Neuro:  Alert and Oriented x 3, Strength and sensation are intact Psych: euthymic mood, full affect  Wt Readings from Last 3 Encounters:  02/21/20 149 lb (67.6 kg)  03/28/19 155 lb (70.3 kg)  02/13/19 152 lb 1.6 oz (69 kg)      Studies/Labs Reviewed:   EKG:  EKG is ordered today.  The ekg ordered today demonstrates afib at rate of 86 bpm  Recent Labs: No results found for requested labs within last 8760 hours.   Lipid Panel No results found for: CHOL, TRIG, HDL, CHOLHDL, VLDL, LDLCALC, LDLDIRECT  Additional studies/ records that were reviewed today include:   Echocardiogram: 11/2017 Study Conclusions   - Left ventricle: The cavity size was normal. Systolic function was  normal. The estimated ejection fraction was in the range of 50%  to 55%. Wall motion was normal; there were no regional wall  motion abnormalities.  - Aortic valve: There was mild regurgitation.  - Mitral valve: There was trivial regurgitation.  - Right ventricle: Systolic function was normal.  - Atrial septum: No defect or patent foramen ovale was identified.  - Tricuspid valve: There was trivial  regurgitation. Peak RV-RA  gradient (S): 19 mm Hg.  - Pulmonic valve: There was no significant regurgitation.  - Pulmonary arteries: Systolic pressure was within the normal  range. PA peak pressure: 22 mm Hg (S).   Impressions:   - Atrial fibrillation throughout study. Normal LV function. Mild  AR, trivial MR/TR.      ASSESSMENT & PLAN:    1. persistent atrial fibrillation with failed cardioversion -Rate controlled.  No palpitation or bleeding issue.  Continue Eliquis.  Continue Lopressor at current dose.   2. HTN  -Blood pressure stable on  current medications  3.  Fatigue -Check TSH, CBC and BMP -Questionable symptoms due to persistent Kayla Booth however prefers deconditioning.    Medication Adjustments/Labs and Tests Ordered: Current medicines are reviewed at length with the patient today.  Concerns regarding medicines are outlined above.  Medication changes, Labs and Tests ordered today are listed in the Patient Instructions below. Patient Instructions  Medication Instructions:  Your physician recommends that you continue on your current medications as directed. Please refer to the Current Medication list given to you today.  *If you need a refill on your cardiac medications before your next appointment, please call your pharmacy*   Lab Work: TODAY:  BMET, CBC, & TSH  If you have labs (blood work) drawn today and your tests are completely normal, you will receive your results only by: Marland Kitchen MyChart Message (if you have MyChart) OR . A paper copy in the mail If you have any lab test that is abnormal or we need to change your treatment, we will call you to review the results.   Testing/Procedures: None ordered   Follow-Up: At [facility], you and your health needs are our priority.  As part of our continuing mission to provide you with exceptional heart care, we have created designated Provider Care Teams.  These Care Teams include your primary Cardiologist  (physician) and Advanced Practice Providers (APPs -  Physician Assistants and Nurse Practitioners) who all work together to provide you with the care you need, when you need it.  We recommend signing up for the patient portal called "MyChart".  Sign up information is provided on this After Visit Summary.  MyChart is used to connect with patients for Virtual Visits (Telemedicine).  Patients are able to view lab/test results, encounter notes, upcoming appointments, etc.  Non-urgent messages can be sent to your provider as well.   To learn more about what you can do with MyChart, go to [email or web address].    Your next appointment:   3 month(s)  The format for your next appointment:   In Person  Provider:   Candee Furbish, MD   Other Instructions      Signed, Leanor Kail, PA  02/21/2020 12:32 PM    [facility] [address], [place], [place]  [address] Phone: [phone]; Fax: [phone]

## 2020-02-21 ENCOUNTER — Other Ambulatory Visit: Payer: Self-pay

## 2020-02-21 ENCOUNTER — Encounter: Payer: Self-pay | Admitting: Physician Assistant

## 2020-02-21 ENCOUNTER — Ambulatory Visit: Payer: Medicare HMO | Admitting: Physician Assistant

## 2020-02-21 VITALS — BP 130/80 | HR 86 | Ht 62.0 in | Wt 149.0 lb

## 2020-02-21 DIAGNOSIS — I4821 Permanent atrial fibrillation: Secondary | ICD-10-CM | POA: Diagnosis not present

## 2020-02-21 DIAGNOSIS — Z7901 Long term (current) use of anticoagulants: Secondary | ICD-10-CM

## 2020-02-21 DIAGNOSIS — R5382 Chronic fatigue, unspecified: Secondary | ICD-10-CM | POA: Diagnosis not present

## 2020-02-21 DIAGNOSIS — I10 Essential (primary) hypertension: Secondary | ICD-10-CM | POA: Diagnosis not present

## 2020-02-21 LAB — TSH: TSH: 2.52 u[IU]/mL (ref 0.450–4.500)

## 2020-02-21 LAB — BASIC METABOLIC PANEL
BUN/Creatinine Ratio: 10 — ABNORMAL LOW (ref 12–28)
BUN: 9 mg/dL (ref 8–27)
CO2: 24 mmol/L (ref 20–29)
Calcium: 9.6 mg/dL (ref 8.7–10.3)
Chloride: 104 mmol/L (ref 96–106)
Creatinine, Ser: 0.91 mg/dL (ref 0.57–1.00)
GFR calc Af Amer: 70 mL/min/{1.73_m2} (ref >59)
GFR calc non Af Amer: 61 mL/min/{1.73_m2} (ref >59)
Glucose: 98 mg/dL (ref 65–99)
Potassium: 4.1 mmol/L (ref 3.5–5.2)
Sodium: 142 mmol/L (ref 134–144)

## 2020-02-21 LAB — CBC
Hematocrit: 39.3 % (ref 34.0–46.6)
Hemoglobin: 14.3 g/dL (ref 11.1–15.9)
MCH: 34.9 pg — ABNORMAL HIGH (ref 26.6–33.0)
MCHC: 36.4 g/dL — ABNORMAL HIGH (ref 31.5–35.7)
MCV: 96 fL (ref 79–97)
Platelets: 570 10*3/uL — ABNORMAL HIGH (ref 150–450)
RBC: 4.1 x10E6/uL (ref 3.77–5.28)
RDW: 11.8 % (ref 11.7–15.4)
WBC: 9.5 10*3/uL (ref 3.4–10.8)

## 2020-02-21 NOTE — Patient Instructions (Signed)
Medication Instructions:  Your physician recommends that you continue on your current medications as directed. Please refer to the Current Medication list given to you today.  *If you need a refill on your cardiac medications before your next appointment, please call your pharmacy*   Lab Work: TODAY:  BMET, CBC, & TSH  If you have labs (blood work) drawn today and your tests are completely normal, you will receive your results only by: Marland Kitchen MyChart Message (if you have MyChart) OR . A paper copy in the mail If you have any lab test that is abnormal or we need to change your treatment, we will call you to review the results.   Testing/Procedures: None ordered   Follow-Up: At [facility], you and your health needs are our priority.  As part of our continuing mission to provide you with exceptional heart care, we have created designated Provider Care Teams.  These Care Teams include your primary Cardiologist (physician) and Advanced Practice Providers (APPs -  Physician Assistants and Nurse Practitioners) who all work together to provide you with the care you need, when you need it.  We recommend signing up for the patient portal called "MyChart".  Sign up information is provided on this After Visit Summary.  MyChart is used to connect with patients for Virtual Visits (Telemedicine).  Patients are able to view lab/test results, encounter notes, upcoming appointments, etc.  Non-urgent messages can be sent to your provider as well.   To learn more about what you can do with MyChart, go to [email or web address].    Your next appointment:   3 month(s)  The format for your next appointment:   In Person  Provider:   Candee Furbish, MD   Other Instructions

## 2020-03-10 ENCOUNTER — Telehealth: Payer: Self-pay | Admitting: Pharmacist

## 2020-03-10 DIAGNOSIS — I4821 Permanent atrial fibrillation: Secondary | ICD-10-CM

## 2020-03-10 MED ORDER — APIXABAN 5 MG PO TABS: 5.0000 mg | ORAL_TABLET | Freq: Two times a day (BID) | ORAL | 1 refills | Status: DC

## 2020-03-10 NOTE — Telephone Encounter (Signed)
Patient called and asked for Eliquis Rx to be resent in for 90 days, 180 tablets.  Rx changed.

## 2020-05-18 ENCOUNTER — Other Ambulatory Visit: Payer: Self-pay | Admitting: Cardiology

## 2020-05-28 ENCOUNTER — Encounter: Payer: Self-pay | Admitting: Cardiology

## 2020-05-28 ENCOUNTER — Ambulatory Visit: Payer: Medicare HMO | Admitting: Cardiology

## 2020-05-28 ENCOUNTER — Other Ambulatory Visit: Payer: Self-pay

## 2020-05-28 VITALS — BP 130/70 | HR 95 | Ht 62.0 in | Wt 150.0 lb

## 2020-05-28 DIAGNOSIS — R5382 Chronic fatigue, unspecified: Secondary | ICD-10-CM | POA: Diagnosis not present

## 2020-05-28 DIAGNOSIS — I10 Essential (primary) hypertension: Secondary | ICD-10-CM

## 2020-05-28 DIAGNOSIS — I4821 Permanent atrial fibrillation: Secondary | ICD-10-CM | POA: Diagnosis not present

## 2020-05-28 DIAGNOSIS — Z7901 Long term (current) use of anticoagulants: Secondary | ICD-10-CM | POA: Diagnosis not present

## 2020-05-28 NOTE — Progress Notes (Signed)
Cardiology Office Note:    Date:  05/28/2020   ID:  Kayla Booth, Kayla Booth 1941-01-10, MRN [id number]  PCP:  Pcp, No  [facility] Cardiologist:  Donato Schultz, MD  [facility] Electrophysiologist:  None   Referring MD: No ref. provider found     History of Present Illness:    Kayla Booth is a 79 y.o. female here for follow-up of persistent atrial fibrillation hypertension hyperlipidemia.  Last seen by Vin on 02/21/2020.  Back in July 2019 she was admitted with pneumonia.  Had atrial fibrillation with RVR at that point.  EF was 55% with mild aortic regurgitation.  Elevated ALTs noted.  At that time felt like the atrial fibrillation was triggered by pneumonia.  Atrial fibrillation had returned.  She was asymptomatic.  She had failed cardioversion attempt multiple times.  Overall maintaining rate control.  On metoprolol.  Cost of Eliquis has been high for her.  She was able to receive some assistance earlier.  Past Medical History:  Diagnosis Date  . Abnormal LFTs 12/16/2017  . Acute encephalopathy 12/16/2017  . CAP (community acquired pneumonia) 12/16/2017  . Hereditary spherocytosis (HCC)   . HTN (hypertension)   . Hyperlipidemia   . New onset atrial fibrillation (HCC) 12/16/2017    Past Surgical History:  Procedure Laterality Date  . CARDIOVERSION N/A 01/19/2018   Procedure: CARDIOVERSION;  Surgeon: Wendall Stade, MD;  Location: [facility] ENDOSCOPY;  Service: Cardiovascular;  Laterality: N/A;  . SPLENECTOMY      Current Medications: Current Meds  Medication Sig  . acetaminophen (TYLENOL 8 HOUR) 650 MG CR tablet Take 1 tablet (650 mg total) by mouth every 8 (eight) hours as needed for pain.  Marland Kitchen apixaban (ELIQUIS) 5 MG TABS tablet Take 1 tablet (5 mg total) by mouth 2 (two) times daily.  . cholecalciferol (VITAMIN D) 400 units TABS tablet Take 400 Units by mouth daily.  . diclofenac Sodium (VOLTAREN) 1 % GEL Apply 4 g topically 4 (four) times daily.  Marland Kitchen lidocaine (LIDODERM) 5  % Place 1 patch onto the skin daily. Remove & Discard patch within 12 hours or as directed by MD  . metoprolol tartrate (LOPRESSOR) 25 MG tablet TAKE 1 TABLET BY MOUTH TWICE A DAY  . Omega-3 Fatty Acids (OMEGA-3 PO) Take 520 mg by mouth daily.  . vitamin B-12 (CYANOCOBALAMIN) 1000 MCG tablet Take 1,000 mcg by mouth daily.     Allergies:   Aspirin   Social History   Socioeconomic History  . Marital status: Divorced    Spouse name: Not on file  . Number of children: Not on file  . Years of education: Not on file  . Highest education level: Not on file  Occupational History  . Not on file  Tobacco Use  . Smoking status: Never Smoker  . Smokeless tobacco: Never Used  Vaping Use  . Vaping Use: Never used  Substance and Sexual Activity  . Alcohol use: No  . Drug use: No  . Sexual activity: Not on file  Other Topics Concern  . Not on file  Social History Narrative  . Not on file   Social Determinants of Health   Financial Resource Strain: Not on file  Food Insecurity: Not on file  Transportation Needs: Not on file  Physical Activity: Not on file  Stress: Not on file  Social Connections: Not on file     Family History: The patient's family history includes Diabetes in her mother; Heart failure in an other  family member; Stroke in her mother.  ROS:   Please see the history of present illness.    Denies any fevers chills nausea vomiting syncope bleeding all other systems reviewed and are negative.  EKGs/Labs/Other Studies Reviewed:    The following studies were reviewed today:  Echocardiogram 12/17/2017:  - Left ventricle: The cavity size was normal. Systolic function was  normal. The estimated ejection fraction was in the range of 50%  to 55%. Wall motion was normal; there were no regional wall  motion abnormalities.  - Aortic valve: There was mild regurgitation.  - Mitral valve: There was trivial regurgitation.  - Right ventricle: Systolic function was  normal.  - Atrial septum: No defect or patent foramen ovale was identified.  - Tricuspid valve: There was trivial regurgitation. Peak RV-RA  gradient (S): 19 mm Hg.  - Pulmonic valve: There was no significant regurgitation.  - Pulmonary arteries: Systolic pressure was within the normal  range. PA peak pressure: 22 mm Hg (S).   Impressions:   - Atrial fibrillation throughout study. Normal LV function. Mild  AR, trivial MR/TR.   EKG: Last 24/21-A. fib 89 no other abnormalities.  Recent Labs: 02/21/2020: BUN 9; Creatinine, Ser 0.91; Hemoglobin 14.3; Platelets 570; Potassium 4.1; Sodium 142; TSH 2.520  Recent Lipid Panel No results found for: CHOL, TRIG, HDL, CHOLHDL, VLDL, LDLCALC, LDLDIRECT     Physical Exam:    VS:  BP 130/70 (BP Location: Left Arm, Patient Position: Sitting, Cuff Size: Normal)   Pulse 95   Ht 5\' 2"  (1.575 m)   Wt 150 lb (68 kg)   SpO2 96%   BMI 27.44 kg/m     Wt Readings from Last 3 Encounters:  05/28/20 150 lb (68 kg)  02/21/20 149 lb (67.6 kg)  03/28/19 155 lb (70.3 kg)     GEN:  Well nourished, well developed in no acute distress HEENT: Normal NECK: No JVD; No carotid bruits LYMPHATICS: No lymphadenopathy CARDIAC: irreg, no murmurs, rubs, gallops RESPIRATORY: Subtle inspiratory wheeze heard bilaterally upon initial respiration. ABDOMEN: Soft, non-tender, non-distended MUSCULOSKELETAL:  No edema; No deformity  SKIN: Warm and dry NEUROLOGIC:  Alert and oriented x 3 PSYCHIATRIC:  Normal affect   ASSESSMENT:    1. Permanent atrial fibrillation (HCC)   2. Chronic anticoagulation   3. Essential hypertension   4. Chronic fatigue, unspecified    PLAN:    In order of problems listed above:  Longstanding persistent atrial fibrillation -Overall very good rate control.  Continue with metoprolol. -Has failed cardioversions in the past.  Chronic anticoagulation -Continue with Eliquis.  Cost has been an issue.  She is filled out assistance.   They have given her tier 1 coverage for 2452-month.  Chronic fatigue -Continue to encourage exercise.  Hemoglobin TSH were normal.  She was concerned about her purple lips.  Oxygen saturation is normal hemoglobin is normal.  Reassurance.       Medication Adjustments/Labs and Tests Ordered: Current medicines are reviewed at length with the patient today.  Concerns regarding medicines are outlined above.  No orders of the defined types were placed in this encounter.  No orders of the defined types were placed in this encounter.   Patient Instructions  Medication Instructions:   Your physician recommends that you continue on your current medications as directed. Please refer to the Current Medication list given to you today.  *If you need a refill on your cardiac medications before your next appointment, please call your pharmacy*  Follow-Up: At [facility], you and your health needs are our priority.  As part of our continuing mission to provide you with exceptional heart care, we have created designated Provider Care Teams.  These Care Teams include your primary Cardiologist (physician) and Advanced Practice Providers (APPs -  Physician Assistants and Nurse Practitioners) who all work together to provide you with the care you need, when you need it.  We recommend signing up for the patient portal called "MyChart".  Sign up information is provided on this After Visit Summary.  MyChart is used to connect with patients for Virtual Visits (Telemedicine).  Patients are able to view lab/test results, encounter notes, upcoming appointments, etc.  Non-urgent messages can be sent to your provider as well.   To learn more about what you can do with MyChart, go to [email or web address].    Your next appointment:   6 month(s)  The format for your next appointment:   In Person  Provider:   Candee Furbish, MD        Signed, Candee Furbish, MD  05/28/2020 9:15 AM    [place]

## 2020-05-28 NOTE — Patient Instructions (Signed)
Medication Instructions:   Your physician recommends that you continue on your current medications as directed. Please refer to the Current Medication list given to you today.  *If you need a refill on your cardiac medications before your next appointment, please call your pharmacy*   Follow-Up: At [facility], you and your health needs are our priority.  As part of our continuing mission to provide you with exceptional heart care, we have created designated Provider Care Teams.  These Care Teams include your primary Cardiologist (physician) and Advanced Practice Providers (APPs -  Physician Assistants and Nurse Practitioners) who all work together to provide you with the care you need, when you need it.  We recommend signing up for the patient portal called "MyChart".  Sign up information is provided on this After Visit Summary.  MyChart is used to connect with patients for Virtual Visits (Telemedicine).  Patients are able to view lab/test results, encounter notes, upcoming appointments, etc.  Non-urgent messages can be sent to your provider as well.   To learn more about what you can do with MyChart, go to [email or web address].    Your next appointment:   6 month(s)  The format for your next appointment:   In Person  Provider:   Mark Skains, MD    

## 2020-08-08 ENCOUNTER — Other Ambulatory Visit: Payer: Self-pay

## 2020-08-08 ENCOUNTER — Emergency Department (HOSPITAL_COMMUNITY)
Admission: EM | Admit: 2020-08-08 | Discharge: 2020-08-09 | Disposition: A | Discharge status: Home / Self Care | Payer: Medicare HMO | Attending: Emergency Medicine | Admitting: Emergency Medicine

## 2020-08-08 ENCOUNTER — Emergency Department (HOSPITAL_COMMUNITY): Payer: Medicare HMO

## 2020-08-08 DIAGNOSIS — I10 Essential (primary) hypertension: Secondary | ICD-10-CM | POA: Diagnosis not present

## 2020-08-08 DIAGNOSIS — K625 Hemorrhage of anus and rectum: Secondary | ICD-10-CM | POA: Insufficient documentation

## 2020-08-08 DIAGNOSIS — K5900 Constipation, unspecified: Secondary | ICD-10-CM | POA: Diagnosis not present

## 2020-08-08 DIAGNOSIS — Z79899 Other long term (current) drug therapy: Secondary | ICD-10-CM | POA: Diagnosis not present

## 2020-08-08 DIAGNOSIS — Z7901 Long term (current) use of anticoagulants: Secondary | ICD-10-CM | POA: Diagnosis not present

## 2020-08-08 DIAGNOSIS — K5641 Fecal impaction: Secondary | ICD-10-CM

## 2020-08-08 DIAGNOSIS — I480 Paroxysmal atrial fibrillation: Secondary | ICD-10-CM | POA: Diagnosis not present

## 2020-08-08 LAB — TYPE AND SCREEN
ABO/RH(D): A NEG
Antibody Screen: NEGATIVE

## 2020-08-08 LAB — COMPREHENSIVE METABOLIC PANEL
ALT: 24 U/L (ref 0–44)
AST: 40 U/L (ref 15–41)
Albumin: 4.3 g/dL (ref 3.5–5.0)
Alkaline Phosphatase: 99 U/L (ref 38–126)
Anion gap: 11 (ref 5–15)
BUN: 12 mg/dL (ref 8–23)
CO2: 23 mmol/L (ref 22–32)
Calcium: 9 mg/dL (ref 8.9–10.3)
Chloride: 100 mmol/L (ref 98–111)
Creatinine, Ser: 0.9 mg/dL (ref 0.44–1.00)
GFR, Estimated: >60 mL/min (ref >60)
Glucose, Bld: 133 mg/dL — ABNORMAL HIGH (ref 70–99)
Potassium: 3.5 mmol/L (ref 3.5–5.1)
Sodium: 134 mmol/L — ABNORMAL LOW (ref 135–145)
Total Bilirubin: 2 mg/dL — ABNORMAL HIGH (ref 0.3–1.2)
Total Protein: 7.4 g/dL (ref 6.5–8.1)

## 2020-08-08 LAB — CBC
HCT: 43.7 % (ref 36.0–46.0)
Hemoglobin: 15.6 g/dL — ABNORMAL HIGH (ref 12.0–15.0)
MCH: 34.2 pg — ABNORMAL HIGH (ref 26.0–34.0)
MCHC: 35.7 g/dL (ref 30.0–36.0)
MCV: 95.8 fL (ref 80.0–100.0)
Platelets: 562 10*3/uL — ABNORMAL HIGH (ref 150–400)
RBC: 4.56 MIL/uL (ref 3.87–5.11)
RDW: 11.7 % (ref 11.5–15.5)
WBC: 22.4 10*3/uL — ABNORMAL HIGH (ref 4.0–10.5)
nRBC: 0 % (ref 0.0–0.2)

## 2020-08-08 LAB — POC OCCULT BLOOD, ED: Fecal Occult Bld: NEGATIVE

## 2020-08-08 MED ORDER — IOHEXOL 300 MG/ML  SOLN
80.0000 mL | Freq: Once | INTRAMUSCULAR | Status: AC | PRN
  Administered 2020-08-08: 80 mL via INTRAVENOUS

## 2020-08-08 MED ORDER — SODIUM CHLORIDE 0.9 % IV BOLUS
500.0000 mL | Freq: Once | INTRAVENOUS | Status: AC
Start: 2020-08-08 — End: 2020-08-09
  Administered 2020-08-08: 500 mL via INTRAVENOUS

## 2020-08-08 MED ORDER — METOPROLOL TARTRATE 25 MG PO TABS
25.0000 mg | ORAL_TABLET | Freq: Once | ORAL | Status: AC
  Administered 2020-08-09: 25 mg via ORAL
  Filled 2020-08-08: qty 1

## 2020-08-08 NOTE — ED Triage Notes (Signed)
Pt presents to ED POV. Pt reports that she was trying to have bowel movement and was unsuccessful. Pt reports afterwards she was walking and noticed blood dripping she assumes rectally. Pt denies increased weakness, lethargy, or dizziness. Pt is on eliquis.

## 2020-08-08 NOTE — ED Provider Notes (Signed)
[facility] EMERGENCY DEPARTMENT Provider Note   CSN: [id number] Arrival date & time: 08/08/20  2104     History Chief Complaint  Patient presents with  . Rectal Bleeding    Kayla Booth is a 80 y.o. female past medical history of hypertension, A. fib, presenting emergency department with complaint of rectal bleeding.  She states today she felt constipated and was straining a bowel movement.  She then states she went to undress to get a shower to help produce a bowel movement when she noticed bright red blood dripping from her bottom.  Denies any symptoms of anemia including no fatigue, shortness of breath, weakness, lightheadedness.  Denies abdominal pain.  She is on Eliquis for her A. fib.  No known history of diverticulitis.  Has not had her evening dose of metoprolol today.  The history is provided by the patient.       Past Medical History:  Diagnosis Date  . Abnormal LFTs 12/16/2017  . Acute encephalopathy 12/16/2017  . CAP (community acquired pneumonia) 12/16/2017  . Hereditary spherocytosis ([place])   . HTN (hypertension)   . Hyperlipidemia   . New onset atrial fibrillation ([place]) 12/16/2017    Patient Active Problem List   Diagnosis Date Noted  . HTN (hypertension) 12/16/2017  . CAP (community acquired pneumonia) 12/16/2017  . Paroxysmal A-fib ([place]) 12/16/2017  . Acute encephalopathy 12/16/2017  . Abnormal LFTs 12/16/2017    Past Surgical History:  Procedure Laterality Date  . CARDIOVERSION N/A 01/19/2018   Procedure: CARDIOVERSION;  Surgeon: Josue Hector, MD;  Location: [facility] ENDOSCOPY;  Service: Cardiovascular;  Laterality: N/A;  . SPLENECTOMY       OB History   No obstetric history on file.     Family History  Problem Relation Age of Onset  . Heart failure Other   . Diabetes Mother   . Stroke Mother     Social History   Tobacco Use  . Smoking status: Never Smoker  . Smokeless tobacco: Never Used  Vaping Use  . Vaping Use: Never  used  Substance Use Topics  . Alcohol use: No  . Drug use: No    Home Medications Prior to Admission medications   Medication Sig Start Date End Date Taking? Authorizing Provider  acetaminophen (TYLENOL 8 HOUR) 650 MG CR tablet Take 1 tablet (650 mg total) by mouth every 8 (eight) hours as needed for pain. 09/04/19   Tasia Catchings, Amy V, PA-C  apixaban (ELIQUIS) 5 MG TABS tablet Take 1 tablet (5 mg total) by mouth 2 (two) times daily. 03/10/20 09/06/20  Jerline Pain, MD  cholecalciferol (VITAMIN D) 400 units TABS tablet Take 400 Units by mouth daily.    [provider]  diclofenac Sodium (VOLTAREN) 1 % GEL Apply 4 g topically 4 (four) times daily. 09/04/19   Tasia Catchings, Amy V, PA-C  lidocaine (LIDODERM) 5 % Place 1 patch onto the skin daily. Remove & Discard patch within 12 hours or as directed by MD 09/04/19   Tasia Catchings, Amy V, PA-C  metoprolol tartrate (LOPRESSOR) 25 MG tablet TAKE 1 TABLET BY MOUTH TWICE A DAY 05/19/20   Jerline Pain, MD  Omega-3 Fatty Acids (OMEGA-3 PO) Take 520 mg by mouth daily.    [provider]  vitamin B-12 (CYANOCOBALAMIN) 1000 MCG tablet Take 1,000 mcg by mouth daily.    [provider]    Allergies    Aspirin  Review of Systems   Review of Systems  Gastrointestinal: Positive  for anal bleeding.  Hematological: Bruises/bleeds easily.  All other systems reviewed and are negative.   Physical Exam Updated Vital Signs BP (!) 159/92   Pulse (!) 118   Temp 97.8 F (36.6 C) (Oral)   Resp 19   SpO2 96%   Physical Exam Vitals and nursing note reviewed.  Constitutional:      General: She is not in acute distress.    Appearance: She is well-developed. She is not ill-appearing.  HENT:     Head: Normocephalic and atraumatic.  Eyes:     Conjunctiva/sclera: Conjunctivae normal.  Cardiovascular:     Rate and Rhythm: Tachycardia present. Rhythm irregular.  Pulmonary:     Effort: Pulmonary effort is normal. No respiratory distress.     Breath sounds: Normal  breath sounds.  Abdominal:     General: Bowel sounds are normal.     Palpations: Abdomen is soft.     Tenderness: There is no abdominal tenderness. There is no guarding or rebound.  Genitourinary:    Comments: Exam performed with female RN chaperone present.  Dark stool is present at the rectum.  No tenderness on exam.  There is a stool bolus noted just inside the rectum, this is brown stool.  No gross blood.  No obvious vaginal bleeding Skin:    General: Skin is warm.  Neurological:     Mental Status: She is alert.  Psychiatric:        Behavior: Behavior normal.     ED Results / Procedures / Treatments   Labs (all labs ordered are listed, but only abnormal results are displayed) Labs Reviewed  COMPREHENSIVE METABOLIC PANEL - Abnormal; Notable for the following components:      Result Value   Sodium 134 (*)    Glucose, Bld 133 (*)    Total Bilirubin 2.0 (*)    All other components within normal limits  CBC - Abnormal; Notable for the following components:   WBC 22.4 (*)    Hemoglobin 15.6 (*)    MCH 34.2 (*)    Platelets 562 (*)    All other components within normal limits  URINALYSIS, ROUTINE W REFLEX MICROSCOPIC  POC OCCULT BLOOD, ED  TYPE AND SCREEN  ABO/RH    EKG EKG Interpretation  Date/Time:  Saturday August 08 2020 23:11:34 EST Ventricular Rate:  113 PR Interval:    QRS Duration: 75 QT Interval:  318 QTC Calculation: 436 R Axis:   83 Text Interpretation: Atrial fibrillation Borderline right axis deviation Minimal ST depression, diffuse leads Confirmed by Dene Gentry [phone]) on 08/08/2020 11:14:06 PM   Radiology CT Abdomen Pelvis W Contrast  Result Date: 08/08/2020 CLINICAL DATA:  Rectal bleeding, anticoagulated EXAM: CT ABDOMEN AND PELVIS WITH CONTRAST TECHNIQUE: Multidetector CT imaging of the abdomen and pelvis was performed using the standard protocol following bolus administration of intravenous contrast. CONTRAST:  2mL OMNIPAQUE IOHEXOL 300 MG/ML   SOLN COMPARISON:  None. FINDINGS: Lower chest: No acute pleural or parenchymal lung disease. Hepatobiliary: No focal liver abnormality is seen. No gallstones, gallbladder wall thickening, or biliary dilatation. Pancreas: Unremarkable. No pancreatic ductal dilatation or surrounding inflammatory changes. Spleen: The spleen is surgically absent. Adrenals/Urinary Tract: Adrenal glands are unremarkable. Kidneys are normal, without renal calculi, focal lesion, or hydronephrosis. Bladder is unremarkable. Stomach/Bowel: No bowel obstruction or ileus. There is significant retained stool within the rectal vault, with mild perirectal fat stranding and asymmetric wall thickening at the anorectal junction. Findings could reflect proctitis or stercoral colitis, though follow-up will  be needed to assess resolution after disimpaction and exclude underlying neoplasm. Normal appendix within the right mid abdomen. Vascular/Lymphatic: Aortic atherosclerosis. No enlarged abdominal or pelvic lymph nodes. Reproductive: Uterus and bilateral adnexa are unremarkable. Other: No free fluid or free gas within the peritoneal cavity. No abdominal wall hernia. Musculoskeletal: No acute or destructive bony lesions. Reconstructed images demonstrate chronic appearing wedge compression deformity of the L1 vertebral body. IMPRESSION: 1. Fecal impaction, with perirectal fat stranding and asymmetric wall thickening at the anorectal junction, most consistent with stercoral colitis or less likely proctitis. Follow-up is recommended to ensure resolution after disimpaction, and exclude underlying neoplasm at the anal rectal junction. 2.  Aortic Atherosclerosis (ICD10-I70.0). Electronically Signed   By: Randa Ngo M.D.   On: 08/08/2020 23:39    Procedures Procedures   Medications Ordered in ED Medications  metoprolol tartrate (LOPRESSOR) tablet 25 mg (has no administration in time range)  sodium chloride 0.9 % bolus 500 mL (500 mLs Intravenous  New Bag/Given 08/08/20 2303)  iohexol (OMNIPAQUE) 300 MG/ML solution 80 mL (80 mLs Intravenous Contrast Given 08/08/20 2324)    ED Course  I have reviewed the triage vital signs and the nursing notes.  Pertinent labs & imaging results that were available during my care of the patient were reviewed by me and considered in my medical decision making (see chart for details).    MDM Rules/Calculators/A&P                          Patient presenting for rectal bleeding today with symptoms of constipation.  No abdominal pain or fevers.  Is anticoagulated on Eliquis.  Abdominal exam is benign.  She is afebrile.  Is slightly tachycardic in A. fib.  Has not had her metoprolol this evening, home dose is ordered.  She is noted to have leukocytosis of 22.4, hemoglobin is 15.6.  Rectal exam without gross blood or tenderness though does have palpable stool impaction.  Considering unknown source of bleeding and leukocytosis, CT scan is obtained to rule out diverticulitis or perforation and is consistent with fecal impaction and stercoral colitis.  This is likely because of patient's symptoms today, consider bleeding due to straining.  As Hemoccult is negative.    She is given IV hydration metoprolol to assist her tachycardia.  Performed digital disimpaction, patient attempting to pass bowel movement now.  Care assumed at shift change by PA Sanders.  Plan to reevaluate for heart rate improvement, as well as follow UA and eval if patient is able to have BM.   Patient discussed with and evaluated by Dr. Christy Gentles. Final Clinical Impression(s) / ED Diagnoses Final diagnoses:  None    Rx / DC Orders ED Discharge Orders    None       Robinson, [place] N, PA-C 08/09/20 0009    Ripley Fraise, MD 08/09/20 [phone]

## 2020-08-08 NOTE — ED Notes (Signed)
Patient transported to CT 

## 2020-08-09 LAB — URINALYSIS, ROUTINE W REFLEX MICROSCOPIC
Bilirubin Urine: NEGATIVE
Glucose, UA: NEGATIVE mg/dL
Hgb urine dipstick: NEGATIVE
Ketones, ur: 20 mg/dL — AB
Leukocytes,Ua: NEGATIVE
Nitrite: NEGATIVE
Protein, ur: NEGATIVE mg/dL
Specific Gravity, Urine: 1.029 (ref 1.005–1.030)
pH: 6 (ref 5.0–8.0)

## 2020-08-09 NOTE — ED Provider Notes (Signed)
  3:18 AM Patient had large BM here.  States she feels better.  Her HR has trended back down and now controlled in the 80's.  Remains in afib which is chronic. She is hemodynamically stable.  Feel she is stable for discharge home.  Recommended bowel regimen at home to help regulate stools.  Close follow-up with primary care doctor.  Return here for new concerns.   Larene Pickett, PA-C 08/09/20 Julien Nordmann    Ripley Fraise, MD 08/09/20 [phone]

## 2020-08-09 NOTE — ED Notes (Signed)
E-signature pad unavailable at time of pt discharge. This RN discussed discharge materials with pt and answered all pt questions. Pt stated understanding of discharge material. ? ?

## 2020-08-09 NOTE — Discharge Instructions (Signed)
Can try using stool softener like colace, miralax, etc at home to help regulate bowel movements. Follow-up with your primary care doctor. Return here for any new/acute changes.

## 2020-08-18 ENCOUNTER — Other Ambulatory Visit: Payer: Self-pay | Admitting: Cardiology

## 2020-09-08 ENCOUNTER — Other Ambulatory Visit: Payer: Self-pay | Admitting: Cardiology

## 2020-09-08 DIAGNOSIS — I4821 Permanent atrial fibrillation: Secondary | ICD-10-CM

## 2020-09-08 NOTE — Telephone Encounter (Signed)
Age 80, weight 68kg, SCr 0.9 on 08/08/20 Afib indication, last visit Dec 2021

## 2020-12-11 ENCOUNTER — Ambulatory Visit: Payer: Medicare HMO | Admitting: Cardiology

## 2020-12-29 ENCOUNTER — Emergency Department (HOSPITAL_COMMUNITY)
Admission: EM | Admit: 2020-12-29 | Discharge: 2020-12-29 | Disposition: A | Discharge status: Home / Self Care | Payer: Medicare HMO | Attending: Emergency Medicine | Admitting: Emergency Medicine

## 2020-12-29 ENCOUNTER — Encounter (HOSPITAL_COMMUNITY): Payer: Self-pay

## 2020-12-29 ENCOUNTER — Emergency Department (HOSPITAL_COMMUNITY): Payer: Medicare HMO

## 2020-12-29 ENCOUNTER — Other Ambulatory Visit: Payer: Self-pay

## 2020-12-29 DIAGNOSIS — S6991XA Unspecified injury of right wrist, hand and finger(s), initial encounter: Secondary | ICD-10-CM | POA: Diagnosis present

## 2020-12-29 DIAGNOSIS — S61316A Laceration without foreign body of right little finger with damage to nail, initial encounter: Secondary | ICD-10-CM | POA: Diagnosis not present

## 2020-12-29 DIAGNOSIS — W01198A Fall on same level from slipping, tripping and stumbling with subsequent striking against other object, initial encounter: Secondary | ICD-10-CM | POA: Diagnosis not present

## 2020-12-29 DIAGNOSIS — Z7901 Long term (current) use of anticoagulants: Secondary | ICD-10-CM | POA: Insufficient documentation

## 2020-12-29 DIAGNOSIS — S0003XA Contusion of scalp, initial encounter: Secondary | ICD-10-CM | POA: Diagnosis not present

## 2020-12-29 DIAGNOSIS — Z79899 Other long term (current) drug therapy: Secondary | ICD-10-CM | POA: Insufficient documentation

## 2020-12-29 DIAGNOSIS — I4891 Unspecified atrial fibrillation: Secondary | ICD-10-CM | POA: Diagnosis not present

## 2020-12-29 DIAGNOSIS — R079 Chest pain, unspecified: Secondary | ICD-10-CM | POA: Insufficient documentation

## 2020-12-29 DIAGNOSIS — Y92009 Unspecified place in unspecified non-institutional (private) residence as the place of occurrence of the external cause: Secondary | ICD-10-CM | POA: Diagnosis not present

## 2020-12-29 DIAGNOSIS — I10 Essential (primary) hypertension: Secondary | ICD-10-CM | POA: Insufficient documentation

## 2020-12-29 DIAGNOSIS — W19XXXA Unspecified fall, initial encounter: Secondary | ICD-10-CM

## 2020-12-29 LAB — COMPREHENSIVE METABOLIC PANEL
ALT: 23 U/L (ref 0–44)
AST: 35 U/L (ref 15–41)
Albumin: 4.3 g/dL (ref 3.5–5.0)
Alkaline Phosphatase: 91 U/L (ref 38–126)
Anion gap: 8 (ref 5–15)
BUN: 18 mg/dL (ref 8–23)
CO2: 28 mmol/L (ref 22–32)
Calcium: 8.9 mg/dL (ref 8.9–10.3)
Chloride: 105 mmol/L (ref 98–111)
Creatinine, Ser: 0.82 mg/dL (ref 0.44–1.00)
GFR, Estimated: >60 mL/min (ref >60)
Glucose, Bld: 117 mg/dL — ABNORMAL HIGH (ref 70–99)
Potassium: 3.9 mmol/L (ref 3.5–5.1)
Sodium: 141 mmol/L (ref 135–145)
Total Bilirubin: 1.3 mg/dL — ABNORMAL HIGH (ref 0.3–1.2)
Total Protein: 7.1 g/dL (ref 6.5–8.1)

## 2020-12-29 MED ORDER — ACETAMINOPHEN 325 MG PO TABS: 650.0000 mg | ORAL_TABLET | Freq: Once | ORAL | Status: DC

## 2020-12-29 NOTE — ED Triage Notes (Addendum)
EMS reports from home, unwitnessed fall from standing. Pt states she fell backwards and hit head on dresser, visible hematoma to posterior head. Denies LOC, no other obvious injury. Pt Takes Eliquis.  BP 156/86 HR 96 RR 18 Sp02 95 RA

## 2020-12-29 NOTE — ED Provider Notes (Signed)
[place] DEPT Provider Note   CSN: [id number] Arrival date & time: 12/29/20  1527     History Chief Complaint  Patient presents with   Kayla Booth is a 80 y.o. female.  Patient is a 80 year old female presenting for complaints of fall. Pt admits to mechanical fall in her home, backwards, with head trauma to the back of the scalp after hitting a dresser. Event occurred directly prior to arrival. Pt on Eliquis for A. fib.  Denies any LOC.  Denies any neurological dysfunction, headache, lightheadedness, dizziness.  States that she has been in good health including no URI symptoms, fever, chills, nausea, vomiting, or diarrhea.  Admits to swelling to the back of her head and sternal chest and moving after fall.  The history is provided by the patient. No language interpreter was used.  Fall This is a new problem. The current episode started less than 1 hour ago. The problem has been rapidly improving. Associated symptoms include chest pain. Pertinent negatives include no abdominal pain and no shortness of breath. Nothing aggravates the symptoms.      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[place])    HTN (hypertension)    Hyperlipidemia    New onset atrial fibrillation ([place]) 12/16/2017    Patient Active Problem List   Diagnosis Date Noted   HTN (hypertension) 12/16/2017   CAP (community acquired pneumonia) 12/16/2017   Paroxysmal A-fib ([place]) 12/16/2017   Acute encephalopathy 12/16/2017   Abnormal LFTs 12/16/2017    Past Surgical History:  Procedure Laterality Date   CARDIOVERSION N/A 01/19/2018   Procedure: CARDIOVERSION;  Surgeon: Josue Hector, MD;  Location: [facility] ENDOSCOPY;  Service: Cardiovascular;  Laterality: N/A;   SPLENECTOMY       OB History   No obstetric history on file.     Family  History  Problem Relation Age of Onset   Heart failure Other    Diabetes Mother    Stroke Mother     Social History   Tobacco Use   Smoking status: Never   Smokeless tobacco: Never  Vaping Use   Vaping Use: Never used  Substance Use Topics   Alcohol use: No   Drug use: No    Home Medications Prior to Admission medications   Medication Sig Start Date End Date Taking? Authorizing Provider  acetaminophen (TYLENOL 8 HOUR) 650 MG CR tablet Take 1 tablet (650 mg total) by mouth every 8 (eight) hours as needed for pain. 09/04/19   Tasia Catchings, Amy V, PA-C  cholecalciferol (VITAMIN D) 400 units TABS tablet Take 400 Units by mouth daily.    [provider]  diclofenac Sodium (VOLTAREN) 1 % GEL Apply 4 g topically 4 (four) times daily. 09/04/19   Yu, Amy V, PA-C  ELIQUIS 5 MG TABS tablet TAKE 1 TABLET BY MOUTH TWICE DAILY 09/08/20   Jerline Pain, MD  lidocaine (LIDODERM) 5 % Place 1 patch onto the skin daily. Remove & Discard patch within 12 hours or as directed by MD 09/04/19   Tasia Catchings, Amy V, PA-C  metoprolol tartrate (LOPRESSOR) 25 MG tablet TAKE 1 TABLET BY MOUTH TWICE A DAY 08/18/20   Jerline Pain, MD  Omega-3 Fatty Acids (OMEGA-3 PO) Take 520 mg by mouth daily.    [provider]  vitamin B-12 (CYANOCOBALAMIN) 1000 MCG tablet Take 1,000  mcg by mouth daily.    [provider]    Allergies    Aspirin  Review of Systems   Review of Systems  Constitutional:  Negative for chills and fever.  HENT:  Negative for ear pain and sore throat.   Eyes:  Negative for pain and visual disturbance.  Respiratory:  Negative for cough and shortness of breath.   Cardiovascular:  Positive for chest pain. Negative for palpitations.  Gastrointestinal:  Negative for abdominal pain and vomiting.  Genitourinary:  Negative for dysuria and hematuria.  Musculoskeletal:  Negative for arthralgias and back pain.  Skin:  Positive for wound (swelling to scalp). Negative for color change and rash.   Neurological:  Negative for seizures and syncope.  All other systems reviewed and are negative.  Physical Exam Updated Vital Signs BP (!) 148/79   Pulse 90   Temp 97.9 F (36.6 C) (Oral)   Resp 16   SpO2 97%   Physical Exam Vitals and nursing note reviewed.  Constitutional:      General: She is not in acute distress.    Appearance: She is well-developed.  HENT:     Head:   Eyes:     Conjunctiva/sclera: Conjunctivae normal.  Cardiovascular:     Rate and Rhythm: Normal rate and regular rhythm.     Heart sounds: No murmur heard. Pulmonary:     Effort: Pulmonary effort is normal. No respiratory distress.     Breath sounds: Normal breath sounds.  Abdominal:     Palpations: Abdomen is soft.     Tenderness: There is no abdominal tenderness.  Musculoskeletal:     Right shoulder: Normal. No bony tenderness.     Left shoulder: Normal. No bony tenderness.     Right upper arm: Normal.     Left upper arm: Normal.     Right elbow: Normal.     Left elbow: Normal.     Right forearm: Normal.     Left forearm: Normal.     Right wrist: Normal.     Left wrist: Normal.     Cervical back: Neck supple. No bony tenderness.     Thoracic back: No bony tenderness.     Lumbar back: No bony tenderness.     Right hip: Normal.     Left hip: Normal.     Right upper leg: Normal.     Left upper leg: Normal.     Right knee: Normal.     Left knee: Normal.     Right lower leg: Normal.     Left lower leg: Normal.     Right ankle: Normal.     Left ankle: Normal.     Comments: Tenderness to her sacrum and coccyx.  No ecchymosis, swelling, or hematomas.  Skin:    General: Skin is warm and dry.       Neurological:     Mental Status: She is alert and oriented to person, place, and time.     GCS: GCS eye subscore is 4. GCS verbal subscore is 5. GCS motor subscore is 6.     Cranial Nerves: Cranial nerves are intact.     Sensory: Sensation is intact.     Motor: Motor function is intact.      Coordination: Coordination is intact.     Gait: Gait is intact.    ED Results / Procedures / Treatments   Labs (all labs ordered are listed, but only abnormal results are displayed) Labs Reviewed  COMPREHENSIVE  METABOLIC PANEL    EKG None  Radiology No results found.  Procedures Procedures   Medications Ordered in ED Medications - No data to display  ED Course  I have reviewed the triage vital signs and the nursing notes.  Pertinent labs & imaging results that were available during my care of the patient were reviewed by me and considered in my medical decision making (see chart for details).    MDM Rules/Calculators/A&P                           80 year old female presenting for mechanical fall with head trauma while on Eliquis for A. fib.  On exam patient is alert and oriented x3, no acute distress, afebrile, some vital signs.  Physical exam demonstrates no neurovascular deficits.  Hematoma present to posterior scalp.  No cervical, thoracic, or lumbar spinal tenderness.  Patient has tenderness palpation of sacrum and coccyx.  Notable ecchymosis. Stable chest and pelvic xrays with no acute fractures. Tylenol given.  CT head and neck demonstrates: Head CT: No acute intracranial finding. No skull fracture. Posterior scalp hematoma.   Cervical spine CT: No cervical spine fracture. T1 vertebral body shows slight loss of height anteriorly. This could be old or recent. I favor that it is old. Ordinary cervical spondylosis otherwise.  On re-evaluation of patient, patient remains stable, alert, with normal mental status. No neurovascular deficits. Patient safe for discharge at this time into the care of her son with strict follow up precautions for any development of confusion, vomiting, or neurological dysfunction. All questions answered prior to discharge.   Final Clinical Impression(s) / ED Diagnoses Final diagnoses:  Fall, initial encounter  Hematoma of scalp, initial  encounter    Rx / DC Orders ED Discharge Orders     None        Lianne Cure, DO [id number]

## 2020-12-29 NOTE — ED Notes (Signed)
Patient was assisted to the restroom using the steady.

## 2021-01-08 ENCOUNTER — Other Ambulatory Visit: Payer: Self-pay

## 2021-01-08 ENCOUNTER — Ambulatory Visit
Admission: EM | Admit: 2021-01-08 | Discharge: 2021-01-08 | Disposition: A | Discharge status: Home / Self Care | Payer: Medicare HMO

## 2021-01-08 DIAGNOSIS — M79602 Pain in left arm: Secondary | ICD-10-CM | POA: Diagnosis not present

## 2021-01-08 DIAGNOSIS — W19XXXA Unspecified fall, initial encounter: Secondary | ICD-10-CM | POA: Diagnosis not present

## 2021-01-08 DIAGNOSIS — S0003XA Contusion of scalp, initial encounter: Secondary | ICD-10-CM

## 2021-01-08 DIAGNOSIS — R0789 Other chest pain: Secondary | ICD-10-CM

## 2021-01-08 NOTE — ED Provider Notes (Signed)
[name]    CSN: [id number] Arrival date & time: 01/08/21  1549      History   Chief Complaint Chief Complaint  Patient presents with   Weakness   chest discomfort    HPI Kayla Booth is a 80 y.o. female.   Patient presents for follow-up after ED visit for a fall that occurred on 12/29/2020.  Patient fell into wall on left side of body after tripping over her feet.  Denies any dizziness or near syncope before fall.  Patient and son state that she was told to follow-up with "[facility] provider" after ED visit, so she followed up here at the urgent care.  Patient does not have a PCP currently.  Does take Xarelto for A. fib and is followed closely by cardiologist.  All imaging at recent ED visit was negative for any acute bony abnormalities.  CT of head did show scalp hematoma.  States that she has been doing well and pain has improved.  Scalp hematoma has decreased in size per patient.  Patient denies any headaches, dizziness, blurred vision, confusion, nausea, vomiting, mental status changes.  Son who is present in exam room also denies noticing any mental status changes of patient.  Patient does complain of some mid chest pain that is mild and occurs when she rolls over in the bed.  Denies any shortness of breath.  Denies any current chest pain.  Also having some left-sided arm pain that started yesterday.   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[place])    HTN (hypertension)    Hyperlipidemia    New onset atrial fibrillation ([place]) 12/16/2017    Patient Active Problem List   Diagnosis Date Noted   HTN (hypertension) 12/16/2017   CAP (community acquired pneumonia) 12/16/2017   Paroxysmal A-fib ([place]) 12/16/2017   Acute encephalopathy 12/16/2017   Abnormal LFTs 12/16/2017    Past Surgical History:  Procedure Laterality  Date   CARDIOVERSION N/A 01/19/2018   Procedure: CARDIOVERSION;  Surgeon: Josue Hector, MD;  Location: [facility] ENDOSCOPY;  Service: Cardiovascular;  Laterality: N/A;   SPLENECTOMY      OB History   No obstetric history on file.      Home Medications    Prior to Admission medications   Medication Sig Start Date End Date Taking? Authorizing Provider  acetaminophen (TYLENOL 8 HOUR) 650 MG CR tablet Take 1 tablet (650 mg total) by mouth every 8 (eight) hours as needed for pain. 09/04/19   Tasia Catchings, Amy V, PA-C  cholecalciferol (VITAMIN D) 400 units TABS tablet Take 400 Units by mouth daily.    [provider]  diclofenac Sodium (VOLTAREN) 1 % GEL Apply 4 g topically 4 (four) times daily. 09/04/19   Yu, Amy V, PA-C  ELIQUIS 5 MG TABS tablet TAKE 1 TABLET BY MOUTH TWICE DAILY 09/08/20   Jerline Pain, MD  lidocaine (LIDODERM) 5 % Place 1 patch onto the skin daily. Remove & Discard patch within 12 hours or as directed by MD 09/04/19   Tasia Catchings, Amy V, PA-C  metoprolol tartrate (LOPRESSOR) 25 MG tablet TAKE 1 TABLET BY MOUTH TWICE A DAY 08/18/20   Jerline Pain, MD  Omega-3 Fatty Acids (OMEGA-3 PO) Take 520 mg by mouth daily.    [provider]  vitamin B-12 (CYANOCOBALAMIN) 1000 MCG tablet Take 1,000 mcg by mouth  daily.    [provider]    Family History Family History  Problem Relation Age of Onset   Heart failure Other    Diabetes Mother    Stroke Mother     Social History Social History   Tobacco Use   Smoking status: Never   Smokeless tobacco: Never  Vaping Use   Vaping Use: Never used  Substance Use Topics   Alcohol use: No   Drug use: No     Allergies   Aspirin   Review of Systems Review of Systems Per HPI  Physical Exam Triage Vital Signs ED Triage Vitals  Enc Vitals Group     BP 01/08/21 1602 121/79     Pulse Rate 01/08/21 1602 95     Resp 01/08/21 1602 18     Temp 01/08/21 1602 97.6 F (36.4 C)     Temp Source 01/08/21 1602 Oral     SpO2  01/08/21 1602 97 %     Weight --      Height --      Head Circumference --      Peak Flow --      Pain Score 01/08/21 1604 4     Pain Loc --      Pain Edu? --      Excl. in [place]? --    No data found.  Updated Vital Signs BP 121/79 (BP Location: Right Arm)   Pulse 95   Temp 97.6 F (36.4 C) (Oral)   Resp 18   SpO2 97%   Visual Acuity Right Eye Distance:   Left Eye Distance:   Bilateral Distance:    Right Eye Near:   Left Eye Near:    Bilateral Near:     Physical Exam Constitutional:      General: She is not in acute distress.    Appearance: She is not ill-appearing, toxic-appearing or diaphoretic.  HENT:     Head: Normocephalic. Contusion present.     Comments: Mild contusion, raised area to back of scalp that is consistent with scalp hematoma that was documented in ED on 12/29/2020.  Contusion appears to have decreased in size and is healing well.  No signs of infection.  Neck:     Comments: Patient has marked kyphosis. Cardiovascular:     Rate and Rhythm: Normal rate and regular rhythm.     Pulses: Normal pulses.     Heart sounds: Normal heart sounds.  Pulmonary:     Effort: Pulmonary effort is normal.     Breath sounds: Normal breath sounds.  Chest:     Comments: No tenderness to palpation to chest wall. Abdominal:     General: Abdomen is flat. Bowel sounds are normal. There is no distension.     Palpations: Abdomen is soft.     Tenderness: There is no abdominal tenderness.  Musculoskeletal:        General: Normal range of motion.     Right upper arm: Normal.     Left upper arm: Normal. No swelling, tenderness or bony tenderness.     Right elbow: Normal.     Left elbow: No swelling. Normal range of motion. No tenderness.     Right forearm: Normal.     Left forearm: No swelling, tenderness or bony tenderness.  Skin:    General: Skin is warm and dry.  Neurological:     General: No focal deficit present.     Mental Status: She is alert and oriented to person,  place, and time. Mental status is at baseline.  Psychiatric:        Mood and Affect: Mood normal.        Behavior: Behavior normal.        Thought Content: Thought content normal.        Judgment: Judgment normal.     UC Treatments / Results  Labs (all labs ordered are listed, but only abnormal results are displayed) Labs Reviewed - No data to display  EKG   Radiology No results found.  Procedures Procedures (including critical care time)  Medications Ordered in UC Medications - No data to display  Initial Impression / Assessment and Plan / UC Course  I have reviewed the triage vital signs and the nursing notes.  Pertinent labs & imaging results that were available during my care of the patient were reviewed by me and considered in my medical decision making (see chart for details).     Patient appears to be healing well and progressing well.  Scalp hematoma is healing well.  No signs of infection.  Patient is oriented x3.  No mental status changes noted.  Suspect chest wall contusion that could be causing chest pain that is intermittent.  Do not think EKG is necessary at this time due to pain occurring with movement and recent trauma.  Left arm physical exam benign and patient has full range of motion.  Do not think imaging is necessary at this time.  Patient requesting physical therapy.  PCP assistance was requested for patient for further evaluation and management.  Asked patient and son to go to the hospital if any symptoms worsen or if mental status changes occur.Discussed strict return precautions. Patient and son verbalized understanding and are agreeable with plan.  Final Clinical Impressions(s) / UC Diagnoses   Final diagnoses:  Other chest pain  Fall, initial encounter  Left arm pain  Hematoma of scalp, initial encounter     Discharge Instructions      PCP assistance has been requested for you.  Someone from [facility] health to reach out to you to schedule an  appointment.  Please go to the hospital if chest pain or any pains worsen, confusion develops, headaches develop.     ED Prescriptions   None    PDMP not reviewed this encounter.   Odis Luster, FNP 01/08/21 1839

## 2021-01-08 NOTE — ED Triage Notes (Signed)
Pt c/o a sharp chest pain that happens at night when she "rolls around in the bed" She states that she fell 12/29/20 that required an ED visit and on discharge her papers - per patient - said to "check in with the hospital." She also complains of increased fatigue though states this started in 2019. Denies chest pain or any other pain/discomfort at this time.

## 2021-01-08 NOTE — Discharge Instructions (Addendum)
PCP assistance has been requested for you.  Someone from [facility] health to reach out to you to schedule an appointment.  Please go to the hospital if chest pain or any pains worsen, confusion develops, headaches develop.

## 2021-03-11 ENCOUNTER — Other Ambulatory Visit: Payer: Self-pay | Admitting: Cardiology

## 2021-03-11 DIAGNOSIS — I4821 Permanent atrial fibrillation: Secondary | ICD-10-CM

## 2021-03-11 NOTE — Telephone Encounter (Signed)
Prescription refill request for Eliquis received. Indication: afib  Last office visit: 05/18/2020, [place] Scr: 0.82, 12/29/2020 Age: 80 yo  Weight: 68 kg   Refill sent.

## 2021-04-01 ENCOUNTER — Encounter: Payer: Self-pay | Admitting: Cardiology

## 2021-04-01 ENCOUNTER — Ambulatory Visit: Payer: Medicare HMO | Admitting: Cardiology

## 2021-04-01 ENCOUNTER — Other Ambulatory Visit: Payer: Self-pay

## 2021-04-01 VITALS — BP 120/72 | HR 89 | Ht 62.0 in | Wt 146.0 lb

## 2021-04-01 DIAGNOSIS — Z79899 Other long term (current) drug therapy: Secondary | ICD-10-CM

## 2021-04-01 DIAGNOSIS — I4811 Longstanding persistent atrial fibrillation: Secondary | ICD-10-CM | POA: Diagnosis not present

## 2021-04-01 DIAGNOSIS — Z7901 Long term (current) use of anticoagulants: Secondary | ICD-10-CM | POA: Insufficient documentation

## 2021-04-01 DIAGNOSIS — W19XXXD Unspecified fall, subsequent encounter: Secondary | ICD-10-CM

## 2021-04-01 DIAGNOSIS — I2584 Coronary atherosclerosis due to calcified coronary lesion: Secondary | ICD-10-CM

## 2021-04-01 DIAGNOSIS — I2510 Atherosclerotic heart disease of native coronary artery without angina pectoris: Secondary | ICD-10-CM | POA: Insufficient documentation

## 2021-04-01 DIAGNOSIS — W19XXXA Unspecified fall, initial encounter: Secondary | ICD-10-CM | POA: Insufficient documentation

## 2021-04-01 DIAGNOSIS — R296 Repeated falls: Secondary | ICD-10-CM | POA: Insufficient documentation

## 2021-04-01 DIAGNOSIS — I700 Atherosclerosis of aorta: Secondary | ICD-10-CM | POA: Insufficient documentation

## 2021-04-01 DIAGNOSIS — I10 Essential (primary) hypertension: Secondary | ICD-10-CM

## 2021-04-01 LAB — COMPREHENSIVE METABOLIC PANEL
ALT: 17 IU/L (ref 0–32)
AST: 28 IU/L (ref 0–40)
Albumin/Globulin Ratio: 2 (ref 1.2–2.2)
Albumin: 4.3 g/dL (ref 3.7–4.7)
Alkaline Phosphatase: 121 IU/L (ref 44–121)
BUN/Creatinine Ratio: 13 (ref 12–28)
BUN: 12 mg/dL (ref 8–27)
Bilirubin Total: 1.2 mg/dL (ref 0.0–1.2)
CO2: 26 mmol/L (ref 20–29)
Calcium: 9.7 mg/dL (ref 8.7–10.3)
Chloride: 104 mmol/L (ref 96–106)
Creatinine, Ser: 0.9 mg/dL (ref 0.57–1.00)
Globulin, Total: 2.2 g/dL (ref 1.5–4.5)
Glucose: 90 mg/dL (ref 70–99)
Potassium: 4.5 mmol/L (ref 3.5–5.2)
Sodium: 141 mmol/L (ref 134–144)
Total Protein: 6.5 g/dL (ref 6.0–8.5)
eGFR: 65 mL/min/{1.73_m2} (ref >59)

## 2021-04-01 LAB — LIPID PANEL
Chol/HDL Ratio: 3.4 ratio (ref 0.0–4.4)
Cholesterol, Total: 210 mg/dL — ABNORMAL HIGH (ref 100–199)
HDL: 62 mg/dL (ref >39)
LDL Chol Calc (NIH): 128 mg/dL — ABNORMAL HIGH (ref 0–99)
Triglycerides: 113 mg/dL (ref 0–149)
VLDL Cholesterol Cal: 20 mg/dL (ref 5–40)

## 2021-04-01 LAB — CBC
Hematocrit: 39.7 % (ref 34.0–46.6)
Hemoglobin: 14.1 g/dL (ref 11.1–15.9)
MCH: 34.2 pg — ABNORMAL HIGH (ref 26.6–33.0)
MCHC: 35.5 g/dL (ref 31.5–35.7)
MCV: 96 fL (ref 79–97)
Platelets: 550 10*3/uL — ABNORMAL HIGH (ref 150–450)
RBC: 4.12 x10E6/uL (ref 3.77–5.28)
RDW: 11.5 % — ABNORMAL LOW (ref 11.7–15.4)
WBC: 8.4 10*3/uL (ref 3.4–10.8)

## 2021-04-01 LAB — T4, FREE: Free T4: 1.24 ng/dL (ref 0.82–1.77)

## 2021-04-01 LAB — TSH: TSH: 3.53 u[IU]/mL (ref 0.450–4.500)

## 2021-04-01 NOTE — Assessment & Plan Note (Signed)
On Eliquis.  Cost at times has been an issue.  She is filled out assistance form in the past.  In the past they have given her tier 1 coverage for 3 months.  No bleeding.  Hemoglobin and creatinine reviewed.

## 2021-04-01 NOTE — Progress Notes (Signed)
Cardiology Office Note:    Date:  04/01/2021   ID:  Kayla Booth, Kayla Booth 10/01/1940, MRN [id number]  PCP:  Patient, No Pcp Per (Inactive)  [facility] Cardiologist:  Candee Furbish, MD  [place] Electrophysiologist:  None   Referring MD: No ref. provider found    History of Present Illness:    Kayla Booth is a 80 y.o. female here for the follow-up of hypertension and atrial fibrillation.  She was seen in the ED 12/29/2020 following a mechanical fall in her home, backwards, with head trauma after hitting a dresser and associated chest pain. Stable chest and pelvic xrays with no acute fractures, and tylenol was given. She followed up with urgent care on 01/08/2021, and reported some mid chest pain that is mild and occurs when rolling over in bed.  Last seen by Vin on 02/21/2020.  Back in July 2019 she was admitted with pneumonia.  Had atrial fibrillation with RVR at that point.  EF was 55% with mild aortic regurgitation.  Elevated ALTs noted.  At that time felt like the atrial fibrillation was triggered by pneumonia.  Atrial fibrillation had returned.  She was asymptomatic.  She had failed cardioversion attempt multiple times.  Overall maintaining rate control.  On metoprolol.  Cost of Eliquis has been high for her.  She was able to receive some assistance earlier.  Today:  Overall she says she was injured and her head swelled. She said her head was blue in the area where she made impact.   She says that she does not have a PCP at the moment  She says that she has been losing hair since she came from the hospital. She says that she has been have short intermittent chest pains, and that it is not constant.  She denies any palpitations, or shortness of breath. No lightheadedness, headaches, syncope, orthopnea, PND, lower extremity edema or exertional symptoms.  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   cholecalciferol (VITAMIN D) 400 units TABS tablet Take 400 Units by mouth daily.   diclofenac Sodium (VOLTAREN) 1 % GEL Apply 4 g topically 4 (four) times daily.   ELIQUIS 5 MG TABS tablet TAKE 1 TABLET BY MOUTH TWICE DAILY   lidocaine (LIDODERM) 5 % Place 1 patch onto the skin daily. Remove & Discard patch within 12 hours or as directed by MD   metoprolol tartrate (LOPRESSOR) 25 MG tablet TAKE 1 TABLET BY MOUTH TWICE A DAY   Omega-3 Fatty Acids (OMEGA-3 PO) Take 520 mg by mouth daily.   vitamin B-12 (CYANOCOBALAMIN) 1000 MCG tablet Take 1,000 mcg by mouth daily.     Allergies:   Aspirin   Social History   Socioeconomic History   Marital status: Divorced    Spouse name: Not on file   Number of children: Not  on file   Years of education: Not on file   Highest education level: Not on file  Occupational History   Not on file  Tobacco Use   Smoking status: Never   Smokeless tobacco: Never  Vaping Use   Vaping Use: Never used  Substance and Sexual Activity   Alcohol use: No   Drug use: No   Sexual activity: Not on file  Other Topics Concern   Not on file  Social History Narrative   Not on file   Social Determinants of Health   Financial Resource Strain: Not on file  Food Insecurity: Not on file  Transportation Needs: Not on file  Physical Activity: Not on file  Stress: Not on file  Social  Connections: Not on file     Family History: The patient's family history includes Diabetes in her mother; Heart failure in an other family member; Stroke in her mother.  ROS:   Please see the history of present illness. (+) Chest Pain All other systems are reviewed and negative.    EKGs/Labs/Other Studies Reviewed:    The following studies were reviewed today:  CT  Chest 12/18/2017: IMPRESSION: Peripheral, somewhat nodular airspace disease scattered throughout the lungs bilaterally. Density on the prior chest x-ray represents anterior pleural based airspace disease in the right middle lobe and lingula. Findings most compatible with infectious process, possibly multifocal pneumonia or indolent infection such as MAI. This could be followed with repeat chest CT in 3 months after treatment to ensure stability or regression of disease.   Trace right pleural effusion.   Cardiomegaly.   Scattered coronary artery and aortic calcifications.  Echocardiogram 12/17/2017:  - Left ventricle: The cavity size was normal. Systolic function was    normal. The estimated ejection fraction was in the range of 50%    to 55%. Wall motion was normal; there were no regional wall    motion abnormalities.  - Aortic valve: There was mild regurgitation.  - Mitral valve: There was trivial regurgitation.  - Right ventricle: Systolic function was normal.  - Atrial septum: No defect or patent foramen ovale was identified.  - Tricuspid valve: There was trivial regurgitation. Peak RV-RA    gradient (S): 19 mm Hg.  - Pulmonic valve: There was no significant regurgitation.  - Pulmonary arteries: Systolic pressure was within the normal    range. PA peak pressure: 22 mm Hg (S).   Impressions:   - Atrial fibrillation throughout study. Normal LV function. Mild    AR, trivial MR/TR.   EKG: EKG is personally reviewed and interpreted. 04/01/2021: AFIB . Rate 89 bpm. Last 24/21-A. fib 89 no other  abnormalities.  Recent Labs: 08/08/2020: Hemoglobin 15.6; Platelets 562 12/29/2020: ALT 23; BUN 18; Creatinine, Ser 0.82; Potassium 3.9; Sodium 141   Recent Lipid Panel No results found for: CHOL, TRIG, HDL, CHOLHDL, VLDL, LDLCALC, LDLDIRECT     Physical Exam:    VS:  BP 120/72 (BP Location: Left Arm, Patient Position: Sitting, Cuff Size: Normal)   Pulse 89   Ht 5\' 2"  (1.575 m)   Wt 146 lb (66.2 kg)   SpO2 97%   BMI 26.70 kg/m     Wt Readings from Last 3 Encounters:  04/01/21 146 lb (66.2 kg)  05/28/20 150 lb (68 kg)  02/21/20 149 lb (67.6 kg)     GEN: Well nourished, well developed in no acute distress HEENT: Normal NECK: No JVD; No carotid bruits LYMPHATICS: No lymphadenopathy CARDIAC: Irregular, no murmurs, rubs, gallops  RESPIRATORY:  Subtle inspiratory wheeze heard bilaterally upon initial respiration. ABDOMEN: Soft, non-tender, non-distended MUSCULOSKELETAL:  No edema; No deformity  SKIN: Warm and dry NEUROLOGIC:  Alert and oriented x 3 PSYCHIATRIC:  Normal affect    ASSESSMENT:    1. Essential hypertension   2. Longstanding persistent atrial fibrillation ([place])   3. Chronic anticoagulation   4. Coronary artery calcification   5. Aortic atherosclerosis ([place])   6. Fall, subsequent encounter   7. Medication management     PLAN:    In order of problems listed above:  Longstanding persistent atrial fibrillation (HCC) Continue with good rate control, metoprolol at current dosing.  No changes made.  In the past has failed cardioversions.  Tolerating rate control atrial fibrillation well.  Checking labs today.  Chronic anticoagulation On Eliquis.  Cost at times has been an issue.  She is filled out assistance form in the past.  In the past they have given her tier 1 coverage for 3 months.  No bleeding.  Hemoglobin and creatinine reviewed.  Coronary artery calcification Continue with goal-directed medical therapy.  Ultimately would benefit from statin use.  She  is not on aspirin because of Eliquis.  This would increase bleeding risk.  Aortic atherosclerosis ([place]) As with coronary artery calcification, would benefit from statin use.  Currently taking Eliquis, metoprolol.  On omega-3.  Fall Recent fall resulting in emergency department visit in August 2022.  Mechanical fall.  No syncope.  No inciting cardiac causes.  We will try to assist in her finding a new primary care physician. With hair loss, we will check TSH and free T4. Encourage physical therapy.    Follow-up:   6 months APP  Medication Adjustments/Labs and Tests Ordered: Current medicines are reviewed at length with the patient today.  Concerns regarding medicines are outlined above.  Orders Placed This Encounter  Procedures   CBC   Comprehensive metabolic panel   Lipid panel   T4, free   TSH   EKG 12-Lead    No orders of the defined types were placed in this encounter.   Patient Instructions  Medication Instructions:  The current medical regimen is effective;  continue present plan and medications.  *If you need a refill on your cardiac medications before your next appointment, please call your pharmacy*   Lab Work: Please have blood work today (CBC, BMP, Lipid, TSH and FreeT4)  If you have labs (blood work) drawn today and your tests are completely normal, you will receive your results only by: [place] (if you have MyChart) OR A paper copy in the mail If you have any lab test that is abnormal or we need to change your treatment, we will call you to review the results.  Follow-Up: At [facility], you and your health needs are our priority.  As part of our continuing mission to provide you with exceptional heart care, we have created designated Provider Care Teams.  These Care Teams include your primary Cardiologist (physician) and Advanced Practice Providers (APPs -  Physician Assistants and Nurse Practitioners) who all work together to provide you with the  care you need, when you need it.  We recommend signing up for the patient portal called "MyChart".  Sign up information is provided on this After Visit Summary.  MyChart is used to connect with patients for Virtual Visits (Telemedicine).  Patients are able to view lab/test results, encounter notes, upcoming appointments, etc.  Non-urgent messages can be sent to your provider as well.  To learn more about what you can do with MyChart, go to [email or web address].    Your next appointment:   6 month(s)  The format for your next appointment:   In Person  Provider:   You will see one of the following Advanced Practice Providers on your designated Care Team:   Any NP/PA   Thank you for choosing [facility]!!     I,Mathew Stumpf,acting as a scribe for Candee Furbish, MD.,have documented all relevant documentation on the behalf of Candee Furbish, MD,as directed by  Candee Furbish, MD while in the presence of Candee Furbish, MD.  I, Candee Furbish, MD, have reviewed all documentation for this visit. The documentation on 04/01/21 for the exam, diagnosis, procedures, and orders are all accurate and complete.   Signed, Candee Furbish, MD  04/01/2021 9:56 AM    [facility]

## 2021-04-01 NOTE — Assessment & Plan Note (Signed)
Continue with goal-directed medical therapy.  Ultimately would benefit from statin use.  She is not on aspirin because of Eliquis.  This would increase bleeding risk.

## 2021-04-01 NOTE — Patient Instructions (Signed)
Medication Instructions:  The current medical regimen is effective;  continue present plan and medications.  *If you need a refill on your cardiac medications before your next appointment, please call your pharmacy*   Lab Work: Please have blood work today (CBC, BMP, Lipid, TSH and FreeT4)  If you have labs (blood work) drawn today and your tests are completely normal, you will receive your results only by: [place] (if you have MyChart) OR A paper copy in the mail If you have any lab test that is abnormal or we need to change your treatment, we will call you to review the results.  Follow-Up: At [facility], you and your health needs are our priority.  As part of our continuing mission to provide you with exceptional heart care, we have created designated Provider Care Teams.  These Care Teams include your primary Cardiologist (physician) and Advanced Practice Providers (APPs -  Physician Assistants and Nurse Practitioners) who all work together to provide you with the care you need, when you need it.  We recommend signing up for the patient portal called "MyChart".  Sign up information is provided on this After Visit Summary.  MyChart is used to connect with patients for Virtual Visits (Telemedicine).  Patients are able to view lab/test results, encounter notes, upcoming appointments, etc.  Non-urgent messages can be sent to your provider as well.   To learn more about what you can do with MyChart, go to [email or web address].    Your next appointment:   6 month(s)  The format for your next appointment:   In Person  Provider:   You will see one of the following Advanced Practice Providers on your designated Care Team:   Any NP/PA   Thank you for choosing [facility]!!

## 2021-04-01 NOTE — Assessment & Plan Note (Signed)
As with coronary artery calcification, would benefit from statin use.  Currently taking Eliquis, metoprolol.  On omega-3.

## 2021-04-01 NOTE — Assessment & Plan Note (Signed)
Recent fall resulting in emergency department visit in August 2022.  Mechanical fall.  No syncope.  No inciting cardiac causes.

## 2021-04-01 NOTE — Assessment & Plan Note (Addendum)
Continue with good rate control, metoprolol at current dosing.  No changes made.  In the past has failed cardioversions.  Tolerating rate control atrial fibrillation well.  Checking labs today.

## 2021-05-21 ENCOUNTER — Other Ambulatory Visit: Payer: Self-pay | Admitting: Cardiology

## 2021-08-03 IMAGING — CT CT HEAD W/O CM
3 series · 14 of 47 positions shown, 16 images · non-contrast
Comparison: 11/16/2017

CLINICAL DATA: Unwitnessed fall from standing position. Trauma to
the back of the head.

EXAM:
CT HEAD WITHOUT CONTRAST
CT CERVICAL SPINE WITHOUT CONTRAST
TECHNIQUE: Multidetector CT imaging of the head and cervical spine was
performed following the standard protocol without intravenous
contrast. Multiplanar CT image reconstructions of the cervical spine
were also generated.

[Series 3: head wo · axial · 0.47mm/px · z∈[+1319,+1449]mm · 8 of 32 slices shown, 10 images]
[im 3/32  brain]
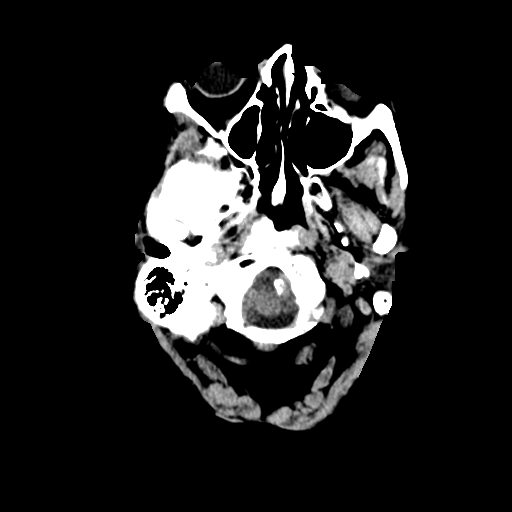
[im 3/32  bone]
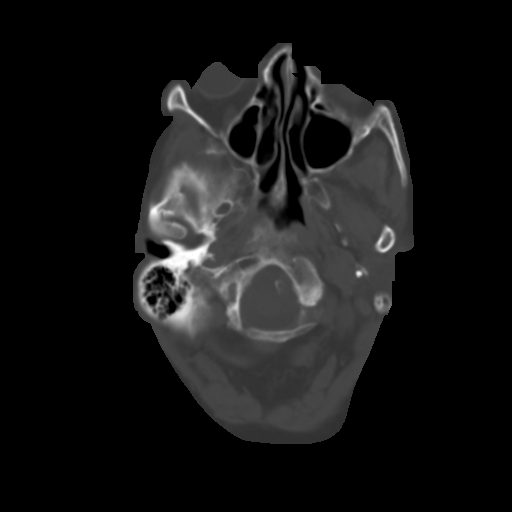
[im 7/32  brain]
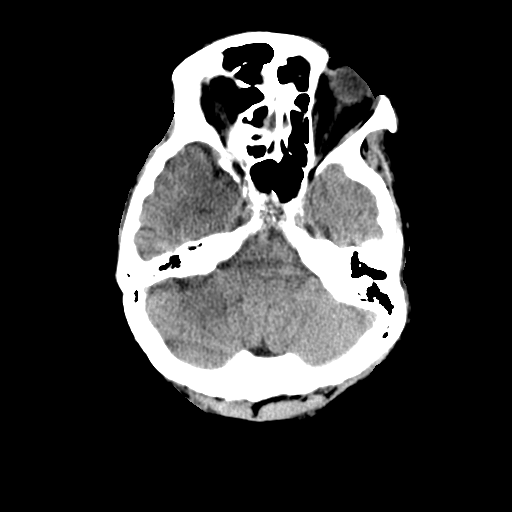
[im 10/32  brain]
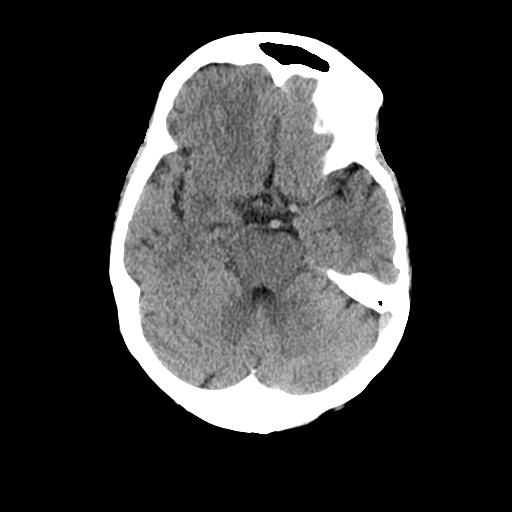
[im 14/32  brain]
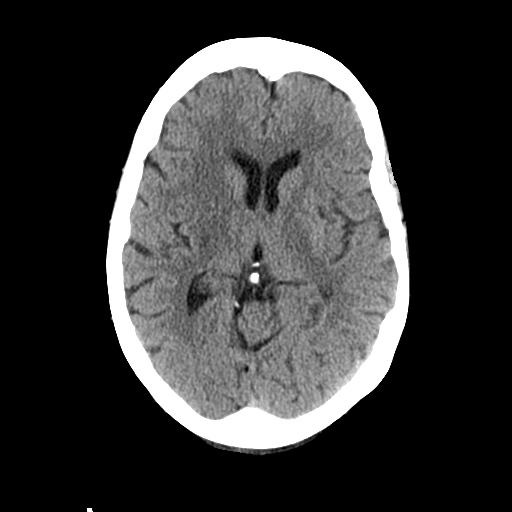
[im 18/32  brain]
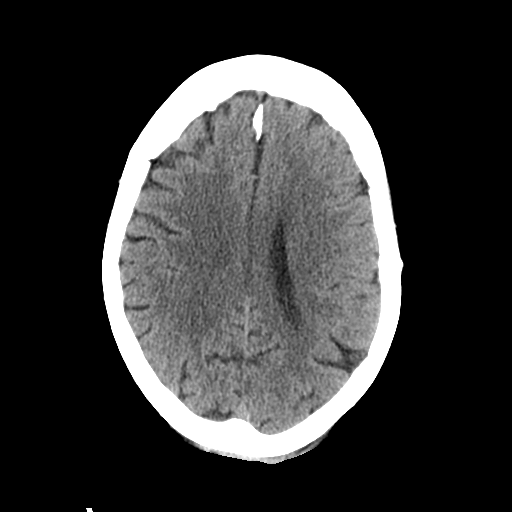
[im 18/32  bone]
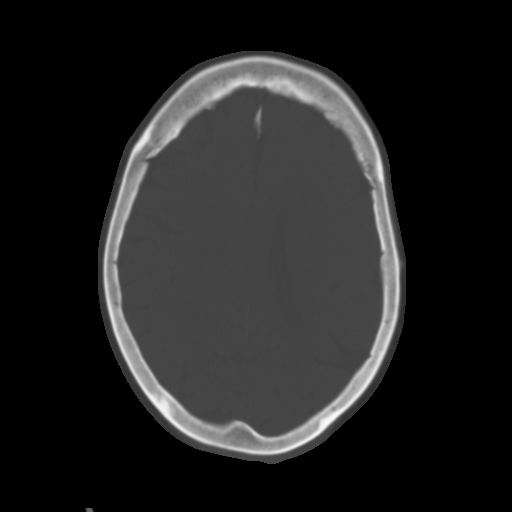
[im 22/32  brain]
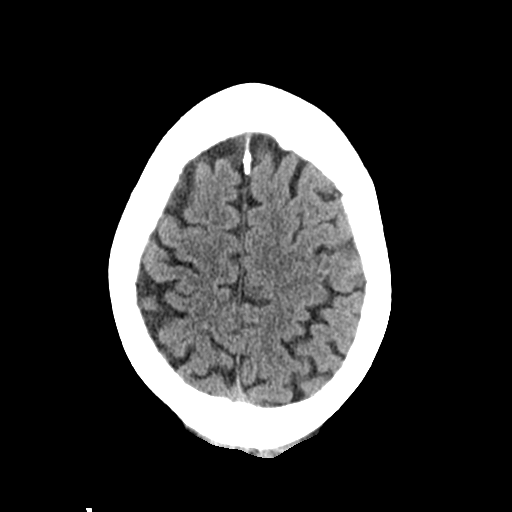
[im 25/32  brain]
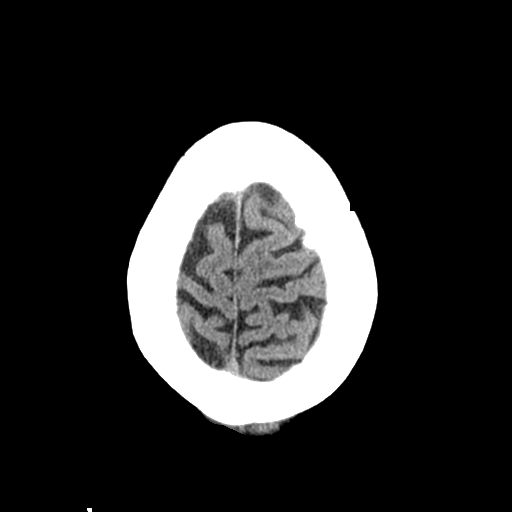
[im 29/32  brain]
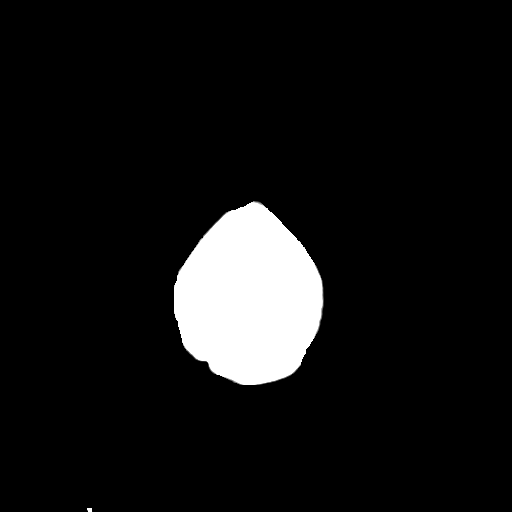

[Series 5: coronal soft tissue · coronal · 0.31mm/px · 3 of 71 slices shown]
[im 24/71  brain]
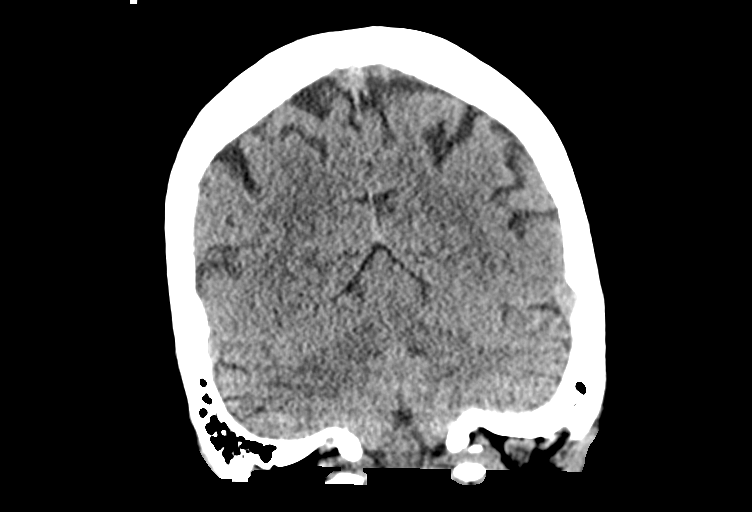
[im 32/71  brain]
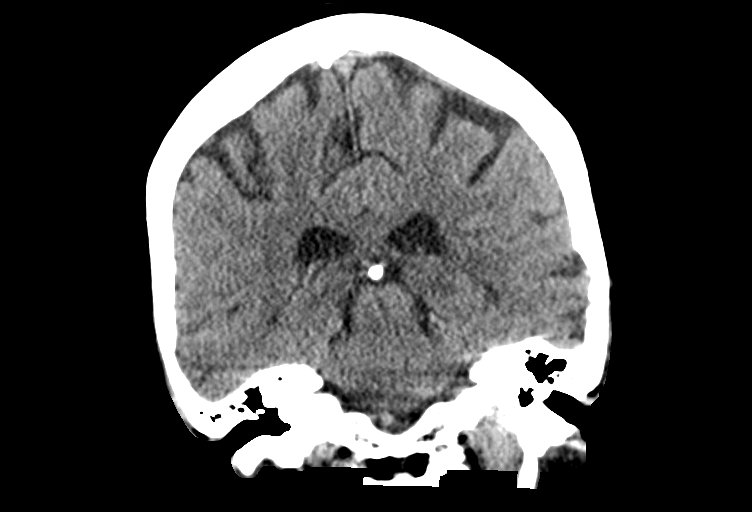
[im 39/71  brain]
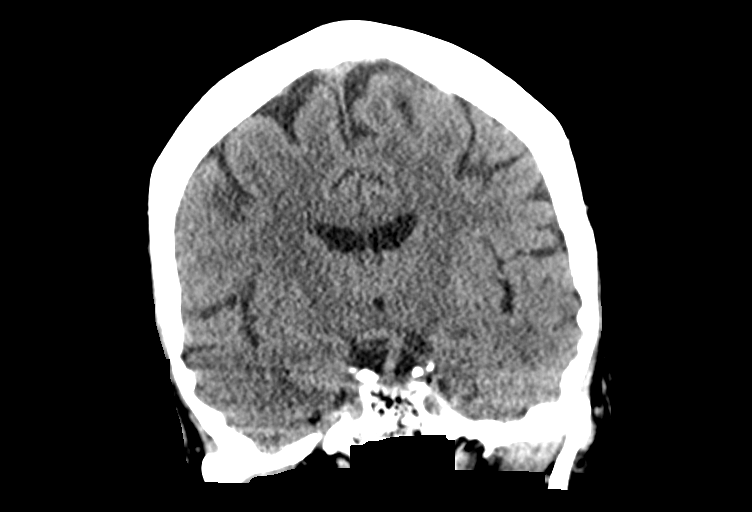

[Series 6: sagittal soft tissue · sagittal · 0.35mm/px · 3 of 54 slices shown]
[im 18/54  brain]
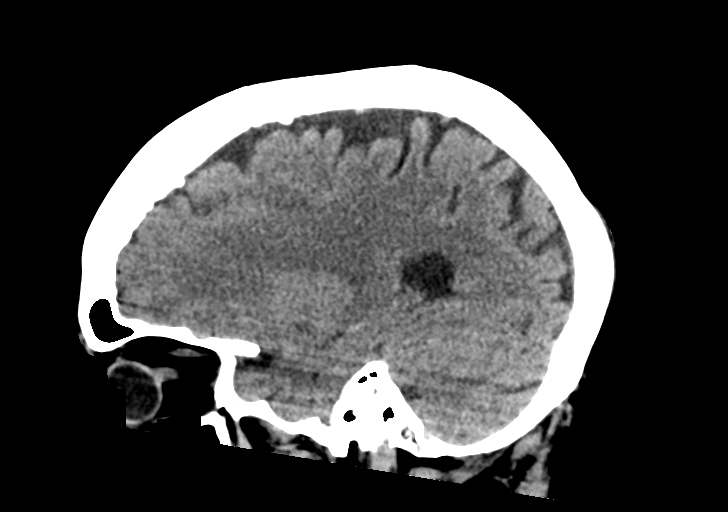
[im 27/54  brain]
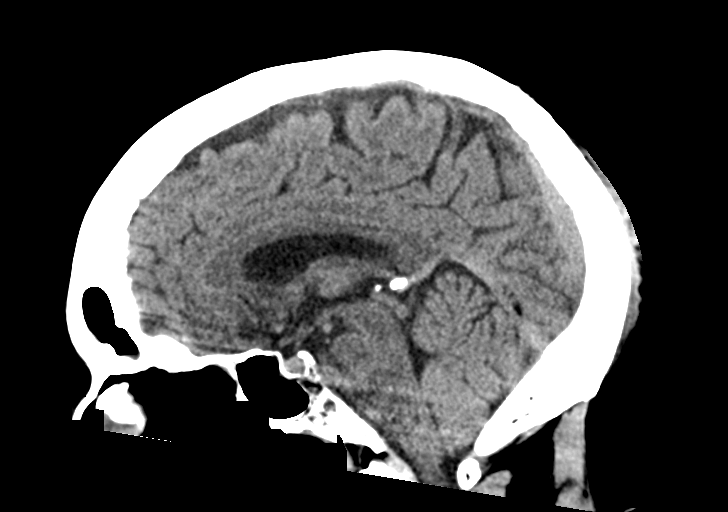
[im 36/54  brain]
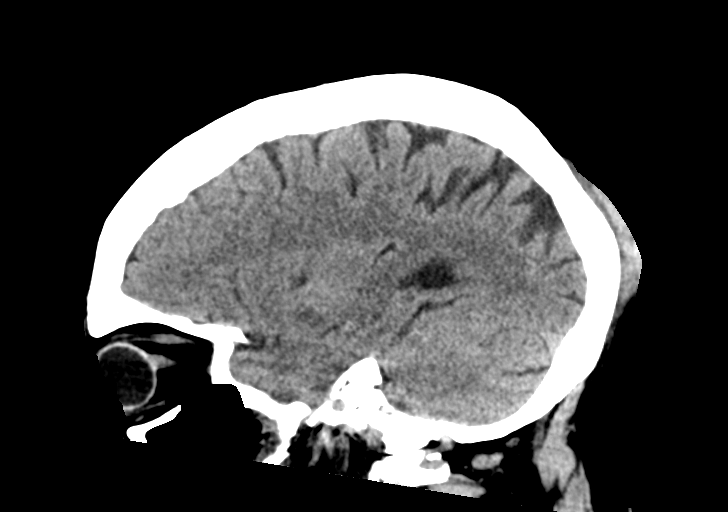

[14 of 47 positions shown; findings below may reference images not displayed]

FINDINGS: CT HEAD FINDINGS

Brain: No evidence of accelerated atrophy. No acute infarction, mass
lesion, hemorrhage, hydrocephalus or extra-axial collection.

Vascular: There is atherosclerotic calcification of the major
vessels at the base of the brain.

Skull: No skull fracture.

Sinuses/Orbits: Clear/normal

Other: Soft tissue swelling of the posterior scalp.

CT CERVICAL SPINE FINDINGS

Alignment: No traumatic malalignment.

Skull base and vertebrae: No acute fracture in the cervical region.
There is mild loss of height anteriorly at T1 which is of
indeterminate age.

Soft tissues and spinal canal: No abnormal soft tissue finding.

Disc levels: Ordinary cervical spondylosis. Chronic facet fusion at
C2-3. Disc space narrowing and osteophyte formation at C3-4, C4-5,
C5-6 and C6-7. No likely compressive stenosis of the canal or
foramina.

Upper chest: Negative except for pleural and parenchymal scarring at
the apices.

Other: None
IMPRESSION: Head CT: No acute intracranial finding. No skull fracture. Posterior
scalp hematoma.

Cervical spine CT: No cervical spine fracture. T1 vertebral body
shows slight loss of height anteriorly. This could be old or recent.
I favor that it is old. Ordinary cervical spondylosis otherwise.

## 2021-08-18 ENCOUNTER — Other Ambulatory Visit: Payer: Self-pay

## 2021-08-18 ENCOUNTER — Emergency Department (HOSPITAL_COMMUNITY)
Admission: EM | Admit: 2021-08-18 | Discharge: 2021-08-19 | Disposition: A | Discharge status: Home / Self Care | Payer: Medicare HMO | Attending: Emergency Medicine | Admitting: Emergency Medicine

## 2021-08-18 ENCOUNTER — Encounter (HOSPITAL_COMMUNITY): Payer: Self-pay | Admitting: Emergency Medicine

## 2021-08-18 DIAGNOSIS — I10 Essential (primary) hypertension: Secondary | ICD-10-CM | POA: Diagnosis not present

## 2021-08-18 DIAGNOSIS — K625 Hemorrhage of anus and rectum: Secondary | ICD-10-CM | POA: Diagnosis present

## 2021-08-18 DIAGNOSIS — D75839 Thrombocytosis, unspecified: Secondary | ICD-10-CM | POA: Diagnosis not present

## 2021-08-18 DIAGNOSIS — Z79899 Other long term (current) drug therapy: Secondary | ICD-10-CM | POA: Insufficient documentation

## 2021-08-18 DIAGNOSIS — Z7901 Long term (current) use of anticoagulants: Secondary | ICD-10-CM | POA: Diagnosis not present

## 2021-08-18 DIAGNOSIS — W010XXA Fall on same level from slipping, tripping and stumbling without subsequent striking against object, initial encounter: Secondary | ICD-10-CM | POA: Diagnosis not present

## 2021-08-18 DIAGNOSIS — K5641 Fecal impaction: Secondary | ICD-10-CM

## 2021-08-18 DIAGNOSIS — S51012A Laceration without foreign body of left elbow, initial encounter: Secondary | ICD-10-CM | POA: Diagnosis not present

## 2021-08-18 LAB — CBC WITH DIFFERENTIAL/PLATELET
Abs Immature Granulocytes: 0.03 10*3/uL (ref 0.00–0.07)
Basophils Absolute: 0 10*3/uL (ref 0.0–0.1)
Basophils Relative: 1 %
Eosinophils Absolute: 0.3 10*3/uL (ref 0.0–0.5)
Eosinophils Relative: 3 %
HCT: 40.2 % (ref 36.0–46.0)
Hemoglobin: 13.8 g/dL (ref 12.0–15.0)
Immature Granulocytes: 0 %
Lymphocytes Relative: 28 %
Lymphs Abs: 2.4 10*3/uL (ref 0.7–4.0)
MCH: 33.7 pg (ref 26.0–34.0)
MCHC: 34.3 g/dL (ref 30.0–36.0)
MCV: 98.3 fL (ref 80.0–100.0)
Monocytes Absolute: 0.9 10*3/uL (ref 0.1–1.0)
Monocytes Relative: 11 %
Neutro Abs: 4.9 10*3/uL (ref 1.7–7.7)
Neutrophils Relative %: 57 %
Platelets: 555 10*3/uL — ABNORMAL HIGH (ref 150–400)
RBC: 4.09 MIL/uL (ref 3.87–5.11)
RDW: 11.9 % (ref 11.5–15.5)
WBC: 8.6 10*3/uL (ref 4.0–10.5)
nRBC: 0 % (ref 0.0–0.2)

## 2021-08-18 LAB — PROTIME-INR
INR: 1.1 (ref 0.8–1.2)
Prothrombin Time: 14.3 seconds (ref 11.4–15.2)

## 2021-08-18 NOTE — ED Triage Notes (Signed)
Pt arrive POV from home for c/o bloody stool that started today, pt is on Eliquis. ?

## 2021-08-18 NOTE — ED Provider Triage Note (Signed)
Emergency Medicine Provider Triage Evaluation Note ? ?Kayla Booth , a 81 y.o. female  was evaluated in triage.  Pt complains of blood in the stool.  Patient is on Eliquis for atrial fibrillation.  She states that she felt like she had to have a bowel movement this afternoon.  She was able to have a small movement, but felt like there was much more stool.  At this time she noted a large amount of red blood being passed through her rectum.  No lightheadedness, chest pain or shortness of breath.  No syncope. ? ?Review of Systems  ?Positive: Blood in stool ?Negative: Abdominal pain, chest pain ? ?Physical Exam  ?BP (!) 159/91   Pulse (!) 115   Temp 97.9 ?F (36.6 ?C) (Oral)   Resp 16   Ht '5\' 2"'$  (1.575 m)   Wt 66.2 kg   SpO2 97%   BMI 26.69 kg/m?  ?Gen:   Awake, no distress   ?Resp:  Normal effort  ?MSK:   Moves extremities without difficulty  ?Other:  Mild tachycardia, abdomen soft and nontender ? ?Medical Decision Making  ?Medically screening exam initiated at 10:54 PM.  Appropriate orders placed.  RICHA SHOR was informed that the remainder of the evaluation will be completed by another provider, this initial triage assessment does not replace that evaluation, and the importance of remaining in the ED until their evaluation is complete. ? ? ?  ?Carlisle Cater, PA-C ?08/18/21 2255 ? ?

## 2021-08-19 ENCOUNTER — Encounter (HOSPITAL_COMMUNITY): Payer: Self-pay

## 2021-08-19 ENCOUNTER — Other Ambulatory Visit: Payer: Self-pay

## 2021-08-19 ENCOUNTER — Ambulatory Visit
Admission: EM | Admit: 2021-08-19 | Discharge: 2021-08-19 | Disposition: A | Discharge status: Home / Self Care | Payer: Medicare HMO

## 2021-08-19 ENCOUNTER — Emergency Department (HOSPITAL_COMMUNITY): Payer: Medicare HMO

## 2021-08-19 ENCOUNTER — Emergency Department (HOSPITAL_COMMUNITY)
Admission: EM | Admit: 2021-08-19 | Discharge: 2021-08-19 | Disposition: A | Discharge status: Home / Self Care | Payer: Medicare HMO | Source: Home / Self Care | Attending: Emergency Medicine | Admitting: Emergency Medicine

## 2021-08-19 DIAGNOSIS — W010XXA Fall on same level from slipping, tripping and stumbling without subsequent striking against object, initial encounter: Secondary | ICD-10-CM | POA: Insufficient documentation

## 2021-08-19 DIAGNOSIS — Z79899 Other long term (current) drug therapy: Secondary | ICD-10-CM | POA: Insufficient documentation

## 2021-08-19 DIAGNOSIS — S41112A Laceration without foreign body of left upper arm, initial encounter: Secondary | ICD-10-CM

## 2021-08-19 DIAGNOSIS — S51012A Laceration without foreign body of left elbow, initial encounter: Secondary | ICD-10-CM | POA: Insufficient documentation

## 2021-08-19 DIAGNOSIS — W19XXXA Unspecified fall, initial encounter: Secondary | ICD-10-CM

## 2021-08-19 LAB — TYPE AND SCREEN
ABO/RH(D): A NEG
Antibody Screen: NEGATIVE

## 2021-08-19 LAB — COMPREHENSIVE METABOLIC PANEL
ALT: 17 U/L (ref 0–44)
AST: 33 U/L (ref 15–41)
Albumin: 3.9 g/dL (ref 3.5–5.0)
Alkaline Phosphatase: 86 U/L (ref 38–126)
Anion gap: 9 (ref 5–15)
BUN: 11 mg/dL (ref 8–23)
CO2: 24 mmol/L (ref 22–32)
Calcium: 9.2 mg/dL (ref 8.9–10.3)
Chloride: 107 mmol/L (ref 98–111)
Creatinine, Ser: 0.89 mg/dL (ref 0.44–1.00)
GFR, Estimated: >60 mL/min (ref >60)
Glucose, Bld: 120 mg/dL — ABNORMAL HIGH (ref 70–99)
Potassium: 3.6 mmol/L (ref 3.5–5.1)
Sodium: 140 mmol/L (ref 135–145)
Total Bilirubin: 1.3 mg/dL — ABNORMAL HIGH (ref 0.3–1.2)
Total Protein: 6.4 g/dL — ABNORMAL LOW (ref 6.5–8.1)

## 2021-08-19 LAB — POC OCCULT BLOOD, ED: Fecal Occult Bld: POSITIVE — AB

## 2021-08-19 MED ORDER — FLEET ENEMA 7-19 GM/118ML RE ENEM
1.0000 | ENEMA | Freq: Once | RECTAL | Status: AC
Start: 2021-08-19 — End: 2021-08-19
  Administered 2021-08-19: 1 via RECTAL
  Filled 2021-08-19: qty 1

## 2021-08-19 MED ORDER — LIDOCAINE-EPINEPHRINE (PF) 2 %-1:200000 IJ SOLN
10.0000 mL | Freq: Once | INTRAMUSCULAR | Status: AC
  Administered 2021-08-19: 10 mL
  Filled 2021-08-19: qty 20

## 2021-08-19 NOTE — ED Provider Notes (Signed)
Patient here today for evaluation of fall and lacerations sustained to left arm earlier today. She reports that she did not hit her head. Arm was hit on wood stove during fall. She was just discharged from ED less than 15 hours ago for rectal bleeding. Given depth of wounds, other co-morbidities, questionable cause of fall, recommended further evaluation in the ED. Son who is with her will transport her POV after leaving our office.  ?  ?Francene Finders, PA-C ?08/19/21 1655 ? ?

## 2021-08-19 NOTE — ED Notes (Signed)
Patient is being discharged from the Urgent Care and sent to the Emergency Department via pov . Per rebecca myers, patient is in need of higher level of care due to deep laceration. Patient is aware and verbalizes understanding of plan of care.  ?Vitals:  ? 08/19/21 1653  ?BP: (!) 145/85  ?Pulse: 85  ?Resp: 19  ?Temp: 98 ?F (36.7 ?C)  ?SpO2: 95%  ? ? ?

## 2021-08-19 NOTE — ED Provider Notes (Signed)
?[facility] ?Provider Note ? ? ?CSN: [id number] ?Arrival date & time: 08/18/21  2228 ? ?  ? ?History ? ?Chief Complaint  ?Patient presents with  ? Rectal Bleeding  ? ? ?Kayla Booth is a 81 y.o. female. ? ?Patient is a 81 year old female with past medical history of atrial fibrillation on Eliquis, hypertension.  Patient presenting today with complaints of rectal bleeding.  Patient reports straining to go to the bathroom throughout the day, then noticed several episodes of passing bright red blood.  She denies having any abdominal pain.  She denies rectal pain except when she attempts to stool.  She denies any fevers or chills.  She denies any vomiting. ? ?The history is provided by the patient.  ?Rectal Bleeding ?Quality:  Bright red ?Amount:  Moderate ?Duration:  1 day ?Timing:  Intermittent ?Chronicity:  New ?Context: constipation   ?Relieved by:  Nothing ?Worsened by:  Nothing ?Ineffective treatments:  None tried ?Associated symptoms: no abdominal pain and no fever   ?Risk factors: anticoagulant use   ? ?  ? ?Home Medications ?Prior to Admission medications   ?Medication Sig Start Date End Date Taking? Authorizing Provider  ?acetaminophen (TYLENOL 8 HOUR) 650 MG CR tablet Take 1 tablet (650 mg total) by mouth every 8 (eight) hours as needed for pain. 09/04/19   Tasia Catchings, Amy V, PA-C  ?cholecalciferol (VITAMIN D) 400 units TABS tablet Take 400 Units by mouth daily.    [provider]  ?diclofenac Sodium (VOLTAREN) 1 % GEL Apply 4 g topically 4 (four) times daily. 09/04/19   Tasia Catchings, Amy V, PA-C  ?ELIQUIS 5 MG TABS tablet TAKE 1 TABLET BY MOUTH TWICE DAILY 03/11/21   Jerline Pain, MD  ?lidocaine (LIDODERM) 5 % Place 1 patch onto the skin daily. Remove & Discard patch within 12 hours or as directed by MD 09/04/19   Cathlean Sauer V, PA-C  ?metoprolol tartrate (LOPRESSOR) 25 MG tablet TAKE 1 TABLET BY MOUTH TWICE A DAY 05/21/21   Jerline Pain, MD  ?Omega-3 Fatty Acids (OMEGA-3 PO) Take  520 mg by mouth daily.    [provider]  ?vitamin B-12 (CYANOCOBALAMIN) 1000 MCG tablet Take 1,000 mcg by mouth daily.    [provider]  ?   ? ?Allergies    ?Aspirin   ? ?Review of Systems   ?Review of Systems  ?Constitutional:  Negative for fever.  ?Gastrointestinal:  Positive for hematochezia. Negative for abdominal pain.  ?All other systems reviewed and are negative. ? ?Physical Exam ?Updated Vital Signs ?BP (!) 159/91   Pulse (!) 115   Temp 97.9 ?F (36.6 ?C) (Oral)   Resp 16   Ht '5\' 2"'$  (1.575 m)   Wt 66.2 kg   SpO2 97%   BMI 26.69 kg/m?  ?Physical Exam ?Vitals and nursing note reviewed.  ?Constitutional:   ?   General: She is not in acute distress. ?   Appearance: She is well-developed. She is not diaphoretic.  ?HENT:  ?   Head: Normocephalic and atraumatic.  ?Cardiovascular:  ?   Rate and Rhythm: Normal rate and regular rhythm.  ?   Heart sounds: No murmur heard. ?  No friction rub. No gallop.  ?Pulmonary:  ?   Effort: Pulmonary effort is normal. No respiratory distress.  ?   Breath sounds: Normal breath sounds. No wheezing.  ?Abdominal:  ?   General: Bowel sounds are normal. There is no distension.  ?   Palpations: Abdomen  is soft.  ?   Tenderness: There is no abdominal tenderness.  ?Genitourinary: ?   Comments: Rectal examination reveals no external hemorrhoids or obvious fissure.  She does have some solid stool in the rectum, but no palpable masses. ?Musculoskeletal:     ?   General: Normal range of motion.  ?   Cervical back: Normal range of motion and neck supple.  ?Skin: ?   General: Skin is warm and dry.  ?Neurological:  ?   General: No focal deficit present.  ?   Mental Status: She is alert and oriented to person, place, and time.  ? ? ?ED Results / Procedures / Treatments   ?Labs ?(all labs ordered are listed, but only abnormal results are displayed) ?Labs Reviewed  ?COMPREHENSIVE METABOLIC PANEL - Abnormal; Notable for the following components:  ?    Result Value  ?  Glucose, Bld 120 (*)   ? Total Protein 6.4 (*)   ? Total Bilirubin 1.3 (*)   ? All other components within normal limits  ?CBC WITH DIFFERENTIAL/PLATELET - Abnormal; Notable for the following components:  ? Platelets 555 (*)   ? All other components within normal limits  ?PROTIME-INR  ?TYPE AND SCREEN  ? ? ?EKG ?None ? ?Radiology ?No results found. ? ?Procedures ?Procedures  ? ? ?Medications Ordered in ED ?Medications - No data to display ? ?ED Course/ Medical Decision Making/ A&P ? ?This patient presents to the ED for concern of rectal bleeding and constipation, this involves an extensive number of treatment options, and is a complaint that carries with it a high risk of complications and morbidity.  The differential diagnosis includes rectal fissure, internal hemorrhoid, bleeding diverticulum ? ? ?Co morbidities that complicate the patient evaluation ? ?Patient anticoagulated with Eliquis ? ? ?Additional history obtained: ? ?No additional history or external records needed ? ? ?Lab Tests: ? ?I Ordered, and personally interpreted labs.  The pertinent results include: Unremarkable CBC and metabolic panel.  Specifically, her hemoglobin is 13.8 ? ? ?Imaging Studies ordered: ? ?No imaging studies ordered or indicated ? ? ?Cardiac Monitoring: / EKG: ? ?No EKG indicated or performed ? ? ?Consultations Obtained: ? ?No consultations needed ? ? ?Problem List / ED Course / Critical interventions / Medication management ? ?Patient presenting with constipation, straining to go to the bathroom, and having bleeding from her rectum while on Eliquis.  Patient had 2 episodes of this at home and presents for evaluation of this.  On rectal exam, there is no evidence for external hemorrhoid and exam is otherwise unremarkable with the exception of some bright red blood that I suspect is related to a tear or fissure.  Patient does have stool within the rectal vault and received an enema in the ER with good results.  She is now feeling  better.  She has had no further bleeding while in the ER and I feel as though can safely be discharged.  I feel the source of her bleeding is likely trauma from attempting to pass large stool.  Her bleeding seems to have resolved.  I will advise her to take stool softeners at home and follow-up as needed. ?I ordered medication including fleets enema for constipation/fecal impaction ?Reevaluation of the patient after these medicines showed that the patient resolved ?I have reviewed the patients home medicines and have made adjustments as needed ? ? ?Social Determinants of Health: ? ?None ? ? ?Test / Admission - Considered: ? ?Patient considered for admission due to rectal  bleeding and anticoagulated state, however has had no further bleeding during her ED stay. ? ?Final Clinical Impression(s) / ED Diagnoses ?Final diagnoses:  ?None  ? ? ?Rx / DC Orders ?ED Discharge Orders   ? ? None  ? ?  ? ? ?  ?Veryl Speak, MD ?08/19/21 [phone] ? ?

## 2021-08-19 NOTE — ED Notes (Signed)
Clean and dry dressing applied to left elbow per Dr. Alvino Chapel.  ?

## 2021-08-19 NOTE — ED Notes (Signed)
An After Visit Summary was printed and given to the patient. Discharge instructions given and no further questions at this time.  Pt leaving with family.  

## 2021-08-19 NOTE — Discharge Instructions (Signed)
Begin taking Metamucil, 1 heaping teaspoon in a glass of water twice daily. ? ?Begin taking Colace (equate stool softener) 100 mg twice daily. ? ?Follow-up with primary doctor if not improving in the next few days, and return to the ER if you develop severe abdominal pain, worsening bleeding, high fever, or other new and concerning symptoms. ?

## 2021-08-19 NOTE — ED Provider Notes (Signed)
?[facility] DEPT ?Provider Note ? ? ?CSN: [id number] ?Arrival date & time: 08/19/21  1730 ? ?  ? ?History ? ?Chief Complaint  ?Patient presents with  ? Fall  ? ? ?Kayla Booth is a 81 y.o. female. ? ? ?Fall ?Pertinent negatives include no chest pain. Patient presents after fall.  States she caught her feet at home and fell onto her left side.  Pain and laceration on left elbow.  Slight pain to hand and skin tear on distal forearm.  Did not hit head.  Is on Eliquis.  Minimal amount of blood at home.  Had been seen yesterday for GI bleed.  States it was not lightheadedness or dizziness that made her fall.  Does not feel lightheaded now.  Seen in urgent care and sent here.  Last tetanus was around 3 years ago. ? ?  ? ?Home Medications ?Prior to Admission medications   ?Medication Sig Start Date End Date Taking? Authorizing Provider  ?acetaminophen (TYLENOL 8 HOUR) 650 MG CR tablet Take 1 tablet (650 mg total) by mouth every 8 (eight) hours as needed for pain. 09/04/19   Kayla Booth  ?cholecalciferol (VITAMIN D) 400 units TABS tablet Take 400 Units by mouth daily.    Provider, Historical, Booth  ?diclofenac Sodium (VOLTAREN) 1 % GEL Apply 4 g topically 4 (four) times daily. 09/04/19   Kayla Booth  ?ELIQUIS 5 MG TABS tablet TAKE 1 TABLET BY MOUTH TWICE DAILY 03/11/21   Kayla Booth  ?lidocaine (LIDODERM) 5 % Place 1 patch onto the skin daily. Remove & Discard patch within 12 hours or as directed by Booth 09/04/19   Kayla Booth  ?metoprolol tartrate (LOPRESSOR) 25 MG tablet TAKE 1 TABLET BY MOUTH TWICE A DAY 05/21/21   Kayla Booth  ?Omega-3 Fatty Acids (OMEGA-3 PO) Take 520 mg by mouth daily.    Provider, Historical, Booth  ?vitamin B-12 (CYANOCOBALAMIN) 1000 MCG tablet Take 1,000 mcg by mouth daily.    Provider, Historical, Booth  ?   ? ?Allergies    ?Aspirin   ? ?Review of Systems   ?Review of Systems  ?Constitutional:  Negative for appetite change.  ?Cardiovascular:  Negative for  chest pain.  ?Skin:  Positive for wound.  ?Neurological:  Negative for weakness.  ? ?Physical Exam ?Updated Vital Signs ?BP 138/70   Pulse 98   Temp 98.3 ?F (36.8 ?C) (Oral)   Resp 18   SpO2 97%  ?Physical Exam ?Vitals and nursing note reviewed.  ?HENT:  ?   Head: Atraumatic.  ?Cardiovascular:  ?   Rate and Rhythm: Normal rate.  ?Abdominal:  ?   Tenderness: There is no abdominal tenderness.  ?Musculoskeletal:  ?   Cervical back: Neck supple.  ?   Comments: 8 cm laceration lateral left elbow.   ?Skin: ?   General: Skin is warm.  ?   Capillary Refill: Capillary refill takes less than 2 seconds.  ?Neurological:  ?   Mental Status: She is alert and oriented to person, place, and time.  ?Skin tear left distal wrist. ? ?ED Results / Procedures / Treatments   ?Labs ?(all labs ordered are listed, but only abnormal results are displayed) ?Labs Reviewed - No data to display ? ?EKG ?None ? ?Radiology ?DG Humerus Left ? ?Result Date: 08/19/2021 ?CLINICAL DATA:  [place], left upper arm pain, abrasions EXAM: LEFT HUMERUS - 2+ VIEW COMPARISON:  None. FINDINGS: Frontal and lateral views of  the left humerus are obtained. There are no acute displaced fractures. Mild osteoarthritis of the acromioclavicular joint. Remaining joint spaces are well preserved. Soft tissues are unremarkable. IMPRESSION: 1. Mild left acromioclavicular joint osteoarthritis. 2. No acute fracture. Electronically Signed   By: Kayla Ngo M.D.   On: 08/19/2021 18:49   ? ?Procedures ?Marland Kitchen.Laceration Repair ? ?Date/Time: 08/19/2021 8:35 PM ?Performed by: Kayla Booth ?Authorized by: Kayla Booth  ? ?Consent:  ?  Consent obtained:  Verbal ?  Consent given by:  Patient ?  Risks discussed:  Infection, pain, poor cosmetic result, need for additional repair, nerve damage and poor wound healing ?  Alternatives discussed:  No treatment ?Anesthesia:  ?  Anesthesia method:  Local infiltration ?  Local anesthetic:  Lidocaine 1% WITH epi ?Laceration details:   ?  Location:  Shoulder/arm ?  Shoulder/arm location:  L elbow ?  Length (cm):  8 ?Pre-procedure details:  ?  Preparation:  Imaging obtained to evaluate for foreign bodies ?Exploration:  ?  Limited defect created (wound extended): no   ?  Imaging obtained: x-ray   ?  Imaging outcome: foreign body not noted   ?  Wound exploration: wound explored through full range of motion and entire depth of wound visualized   ?  Contaminated: no   ?Treatment:  ?  Area cleansed with:  Shur-Clens ?  Amount of cleaning:  Standard ?  Debridement:  None ?  Undermining:  Minimal ?  Scar revision: no   ?  Layers/structures repaired:  Deep dermal/superficial fascia ?Deep dermal/superficial fascia:  ?  Suture size:  4-0 ?  Suture material:  Vicryl ?  Suture technique:  Simple interrupted ?  Number of sutures:  3 ?Skin repair:  ?  Repair method:  Sutures ?  Suture size:  4-0 ?  Suture material:  Prolene ?  Suture technique:  Running ?  Number of sutures:  8 ?Approximation:  ?  Approximation:  Close  ? ? ?Medications Ordered in ED ?Medications  ?lidocaine-EPINEPHrine (XYLOCAINE W/EPI) 2 %-1:200000 (PF) injection 10 mL (10 mLs Infiltration Given by Other 08/19/21 2055)  ? ? ?ED Course/ Medical Decision Making/ A&P ?  ?                        ?Medical Decision Making ?Risk ?Prescription drug management. ? ?Patient presents after fall.  Left elbow laceration.  No underlying bony pathology.  Tetanus up-to-date.  Wound closed.  Eager to go home.  Doubt severe anemia.  Mechanical fall.  Will discharge home.  Is on blood thinners but has not hit head.  Do not feel I need head CT.  Does have approximately 8 cm laceration to left elbow.  Irrigated and closed.  X-ray did not show fracture.  Outpatient follow-up for suture removal in around 10 days.  No other apparent injury.  Do not feel as by need to check hemoglobin at this time.  Reportedly had only minimal blood loss at home ? ? ? ? ? ? ? ? ?Final Clinical Impression(s) / ED Diagnoses ?Final  diagnoses:  ?Fall, initial encounter  ?Elbow laceration, left, initial encounter  ? ? ?Rx / DC Orders ?ED Discharge Orders   ? ? None  ? ?  ? ? ?  ?Kayla Booth ?08/19/21 2337 ? ?

## 2021-08-19 NOTE — Discharge Instructions (Signed)
Have the stitches taken out in around 10 days. ?

## 2021-08-19 NOTE — ED Triage Notes (Signed)
Pt reports tripping and falling today. Pt landed on her left arm and now has a laceration to her left elbow. Pt denies hitting her head or LOC. Pt is taking Eliquis.  ?

## 2021-08-19 NOTE — ED Triage Notes (Signed)
Pt presents with complaints of falling and hitting her left arm on a wood stove. Bleeding controlled. Pt has deep laceration on the side of her left arm and small skin tear on left wrist.  ?

## 2021-08-19 NOTE — ED Notes (Signed)
ED Provider at bedside. 

## 2021-08-31 ENCOUNTER — Emergency Department (HOSPITAL_COMMUNITY)
Admission: EM | Admit: 2021-08-31 | Discharge: 2021-08-31 | Disposition: A | Discharge status: Home / Self Care | Payer: Medicare HMO | Attending: Emergency Medicine | Admitting: Emergency Medicine

## 2021-08-31 ENCOUNTER — Encounter (HOSPITAL_COMMUNITY): Payer: Self-pay | Admitting: Emergency Medicine

## 2021-08-31 DIAGNOSIS — X58XXXD Exposure to other specified factors, subsequent encounter: Secondary | ICD-10-CM | POA: Insufficient documentation

## 2021-08-31 DIAGNOSIS — S41112D Laceration without foreign body of left upper arm, subsequent encounter: Secondary | ICD-10-CM | POA: Diagnosis not present

## 2021-08-31 DIAGNOSIS — Z4802 Encounter for removal of sutures: Secondary | ICD-10-CM

## 2021-08-31 NOTE — ED Provider Notes (Signed)
?[facility] DEPT ?Provider Note ? ? ?CSN: [id number] ?Arrival date & time: 08/31/21  1347 ? ?  ? ?History ? ?Chief Complaint  ?Patient presents with  ? Suture / Staple Removal  ? ? ?Kayla Booth is a 81 y.o. female. ? ? ?Suture / Staple Removal ? ? ?  ? ?Home Medications ?Prior to Admission medications   ?Medication Sig Start Date End Date Taking? Authorizing Provider  ?acetaminophen (TYLENOL 8 HOUR) 650 MG CR tablet Take 1 tablet (650 mg total) by mouth every 8 (eight) hours as needed for pain. 09/04/19   Tasia Catchings, Amy V, PA-C  ?cholecalciferol (VITAMIN D) 400 units TABS tablet Take 400 Units by mouth daily.    [provider]  ?diclofenac Sodium (VOLTAREN) 1 % GEL Apply 4 g topically 4 (four) times daily. 09/04/19   Tasia Catchings, Amy V, PA-C  ?ELIQUIS 5 MG TABS tablet TAKE 1 TABLET BY MOUTH TWICE DAILY 03/11/21   Jerline Pain, MD  ?lidocaine (LIDODERM) 5 % Place 1 patch onto the skin daily. Remove & Discard patch within 12 hours or as directed by MD 09/04/19   Cathlean Sauer V, PA-C  ?metoprolol tartrate (LOPRESSOR) 25 MG tablet TAKE 1 TABLET BY MOUTH TWICE A DAY 05/21/21   Jerline Pain, MD  ?Omega-3 Fatty Acids (OMEGA-3 PO) Take 520 mg by mouth daily.    [provider]  ?vitamin B-12 (CYANOCOBALAMIN) 1000 MCG tablet Take 1,000 mcg by mouth daily.    [provider]  ?   ? ?Allergies    ?Aspirin   ? ?Review of Systems   ?Review of Systems  ?All other systems reviewed and are negative. ? ?Physical Exam ?Updated Vital Signs ?BP 135/83 (BP Location: Right Arm)   Pulse 94   Temp (!) 97.4 ?F (36.3 ?C) (Oral)   Resp 18   Ht '5\' 2"'$  (1.575 m)   Wt 61.2 kg   SpO2 97%   BMI 24.69 kg/m?  ?Physical Exam ?Vitals and nursing note reviewed.  ?Constitutional:   ?   Appearance: Normal appearance.  ?HENT:  ?   Head: Normocephalic and atraumatic.  ?Eyes:  ?   General:     ?   Right eye: No discharge.     ?   Left eye: No discharge.  ?   Conjunctiva/sclera: Conjunctivae normal.  ?Pulmonary:  ?    Effort: Pulmonary effort is normal.  ?Skin: ?   General: Skin is warm and dry.  ?   Findings: No rash.  ?   Comments: Well-healing laceration to the left posterior elbow and upper arm.  No significant erythema or purulence.  Area is not warm to palpation.  Sutures are in place.    ?Neurological:  ?   General: No focal deficit present.  ?   Mental Status: She is alert.  ?Psychiatric:     ?   Mood and Affect: Mood normal.     ?   Behavior: Behavior normal.  ? ? ?ED Results / Procedures / Treatments   ?Labs ?(all labs ordered are listed, but only abnormal results are displayed) ?Labs Reviewed - No data to display ? ?EKG ?None ? ?Radiology ?No results found. ? ?Procedures ?Marland KitchenSuture Removal ? ?Date/Time: 08/31/2021 2:21 PM ?Performed by: Hendricks Limes, PA-C ?Authorized by: Hendricks Limes, PA-C  ? ?Consent:  ?  Consent obtained:  Verbal ?  Consent given by:  Patient ?  Risks, benefits, and alternatives were discussed: yes   ?  Risks discussed:  Bleeding and wound separation ?Universal protocol:  ?  Procedure explained and questions answered to patient or proxy's satisfaction: yes   ?  Relevant documents present and verified: yes   ?  Test results available: no   ?  Imaging studies available: no   ?  Required blood products, implants, devices, and special equipment available: no   ?  Site/side marked: no   ?  Immediately prior to procedure, a time out was called: no   ?  Patient identity confirmed:  Verbally with patient and arm band ?Location:  ?  Location:  Upper extremity ?  Upper extremity location:  Arm ?  Arm location:  L upper arm ?Procedure details:  ?  Wound appearance:  No signs of infection ?  Number of sutures removed:  8 ?Post-procedure details:  ?  Procedure completion:  Tolerated well, no immediate complications ?Comments:  ?   Running stitch removed.  ? ? ?Medications Ordered in ED ?Medications - No data to display ? ?ED Course/ Medical Decision Making/ A&P ?  ?                        ?Medical Decision  Making ? ?Kayla Booth is a 81 y.o. female who presents to the emergency department for suture removal.  Sutures are in place over the left upper extremity.  No signs of infection.  I remove the sutures.  Please see procedure note above for further detail.  Appropriate wound care was discussed with the patient.She is safe for discharge. Strict return precautions discussed.  ? ?Final Clinical Impression(s) / ED Diagnoses ?Final diagnoses:  ?Visit for suture removal  ? ? ?Rx / DC Orders ?ED Discharge Orders   ? ? None  ? ?  ? ? ?  ?Hendricks Limes, PA-C ?08/31/21 1424 ? ?  ?Carmin Muskrat, MD ?08/31/21 1700 ? ?

## 2021-08-31 NOTE — Discharge Instructions (Signed)
Please follow-up with your primary care provider for further evaluation.  Return to the emergency part for any worsening symptoms. ?

## 2021-08-31 NOTE — ED Triage Notes (Signed)
Per patient/family needs stitches removed from left elbow/arm ?

## 2021-08-31 NOTE — ED Notes (Signed)
I provided reinforced discharge education based off of discharge instructions. Pt acknowledged and understood my education. Pt had no further questions/concerns for provider/myself.  °

## 2021-09-05 ENCOUNTER — Emergency Department (HOSPITAL_COMMUNITY)
Admission: EM | Admit: 2021-09-05 | Discharge: 2021-09-05 | Disposition: A | Discharge status: Home / Self Care | Payer: Medicare HMO | Attending: Emergency Medicine | Admitting: Emergency Medicine

## 2021-09-05 ENCOUNTER — Encounter (HOSPITAL_COMMUNITY): Payer: Self-pay

## 2021-09-05 ENCOUNTER — Emergency Department (HOSPITAL_COMMUNITY): Payer: Medicare HMO

## 2021-09-05 DIAGNOSIS — Y92000 Kitchen of unspecified non-institutional (private) residence as  the place of occurrence of the external cause: Secondary | ICD-10-CM | POA: Diagnosis not present

## 2021-09-05 DIAGNOSIS — Z79899 Other long term (current) drug therapy: Secondary | ICD-10-CM | POA: Insufficient documentation

## 2021-09-05 DIAGNOSIS — S8992XA Unspecified injury of left lower leg, initial encounter: Secondary | ICD-10-CM | POA: Insufficient documentation

## 2021-09-05 DIAGNOSIS — Z7901 Long term (current) use of anticoagulants: Secondary | ICD-10-CM | POA: Insufficient documentation

## 2021-09-05 DIAGNOSIS — S0990XA Unspecified injury of head, initial encounter: Secondary | ICD-10-CM | POA: Insufficient documentation

## 2021-09-05 DIAGNOSIS — S8991XA Unspecified injury of right lower leg, initial encounter: Secondary | ICD-10-CM | POA: Insufficient documentation

## 2021-09-05 DIAGNOSIS — S59912A Unspecified injury of left forearm, initial encounter: Secondary | ICD-10-CM | POA: Insufficient documentation

## 2021-09-05 DIAGNOSIS — W01198A Fall on same level from slipping, tripping and stumbling with subsequent striking against other object, initial encounter: Secondary | ICD-10-CM | POA: Insufficient documentation

## 2021-09-05 DIAGNOSIS — I10 Essential (primary) hypertension: Secondary | ICD-10-CM | POA: Insufficient documentation

## 2021-09-05 DIAGNOSIS — S59902A Unspecified injury of left elbow, initial encounter: Secondary | ICD-10-CM | POA: Diagnosis not present

## 2021-09-05 DIAGNOSIS — I4891 Unspecified atrial fibrillation: Secondary | ICD-10-CM | POA: Insufficient documentation

## 2021-09-05 DIAGNOSIS — R062 Wheezing: Secondary | ICD-10-CM | POA: Insufficient documentation

## 2021-09-05 DIAGNOSIS — W19XXXA Unspecified fall, initial encounter: Secondary | ICD-10-CM

## 2021-09-05 NOTE — ED Provider Notes (Signed)
?[facility] ?Provider Note ? ? ?CSN: [id number] ?Arrival date & time: 09/05/21  1834 ? ?  ? ?History ? ?Chief Complaint  ?Patient presents with  ? Fall  ? ? ?WEI NEWBROUGH is a 81 y.o. female with chief complaint of fall.  Hx of recurrent falls.  Patient was standing in the kitchen, attempted to turn, felt dizzy and felt herself falling.  Hit her head, left arm/elbow, and knees when she fell.  Complains of left arm and elbow tenderness, denies head or knee tenderness.  Denies loss of consciousness.  Denies recent fever, upper respiratory infection, cough, neck pain or stiffness.  Chronic history of persistent atrial fibrillation.  On Eliquis.  Denies N/V.  Was instructed by primary that if she fell and hit her head, to come to the ED to be evaluated for head bleed.  Denies numbness or tingling of the extremities, facial asymmetry, disequilibrium, vision changes, or other neuro deficits.  Hx also includes HTN, hyperlipidemia, hereditary spherocytosis. ? ?The history is provided by the patient and medical records.  ?Fall ? ? ?  ? ?Home Medications ?Prior to Admission medications   ?Medication Sig Start Date End Date Taking? Authorizing Provider  ?acetaminophen (TYLENOL 8 HOUR) 650 MG CR tablet Take 1 tablet (650 mg total) by mouth every 8 (eight) hours as needed for pain. 09/04/19   Tasia Catchings, Amy V, PA-C  ?cholecalciferol (VITAMIN D) 400 units TABS tablet Take 400 Units by mouth daily.    [provider]  ?diclofenac Sodium (VOLTAREN) 1 % GEL Apply 4 g topically 4 (four) times daily. 09/04/19   Tasia Catchings, Amy V, PA-C  ?ELIQUIS 5 MG TABS tablet TAKE 1 TABLET BY MOUTH TWICE DAILY 03/11/21   Jerline Pain, MD  ?lidocaine (LIDODERM) 5 % Place 1 patch onto the skin daily. Remove & Discard patch within 12 hours or as directed by MD 09/04/19   Cathlean Sauer V, PA-C  ?metoprolol tartrate (LOPRESSOR) 25 MG tablet TAKE 1 TABLET BY MOUTH TWICE A DAY 05/21/21   Jerline Pain, MD  ?Omega-3 Fatty Acids (OMEGA-3  PO) Take 520 mg by mouth daily.    [provider]  ?vitamin B-12 (CYANOCOBALAMIN) 1000 MCG tablet Take 1,000 mcg by mouth daily.    [provider]  ?   ? ?Allergies    ?Aspirin   ? ?Review of Systems   ?Review of Systems  ?Musculoskeletal:   ?     Left shoulder pain ?Left elbow pain  ?Neurological:  Positive for dizziness.  ?     Fall  ? ?Physical Exam ?Updated Vital Signs ?BP (!) 171/105   Pulse (!) 101   Temp 98 ?F (36.7 ?C) (Oral)   Resp (!) 22   SpO2 97%  ?Physical Exam ?Vitals and nursing note reviewed.  ?Constitutional:   ?   General: She is not in acute distress. ?   Appearance: Normal appearance. She is well-developed. She is not ill-appearing or diaphoretic.  ?HENT:  ?   Head: Normocephalic and atraumatic.  ?   Mouth/Throat:  ?   Mouth: Mucous membranes are moist.  ?   Pharynx: Oropharynx is clear.  ?Eyes:  ?   General: No visual field deficit or scleral icterus. ?   Conjunctiva/sclera: Conjunctivae normal.  ?Cardiovascular:  ?   Rate and Rhythm: Normal rate. Rhythm irregularly irregular.  ?   Pulses:     ?     Radial pulses are 2+ on the right side and  2+ on the left side.  ?     Dorsalis pedis pulses are 1+ on the right side and 2+ on the left side.  ?   Heart sounds: Normal heart sounds. No murmur heard. ?Pulmonary:  ?   Effort: Pulmonary effort is normal. No respiratory distress.  ?   Breath sounds: Wheezing present.  ?Abdominal:  ?   General: Bowel sounds are normal.  ?   Palpations: Abdomen is soft.  ?   Tenderness: There is no abdominal tenderness.  ?Musculoskeletal:     ?   General: No swelling.  ?     Arms: ? ?   Cervical back: Neck supple. No rigidity or tenderness.  ?   Right lower leg: 1+ Edema present.  ?   Left lower leg: No edema.  ?   Comments: Tenderness as depicted above ?Negative for ecchymosis, laceration, obvious deformity ?Mild tenderness of left humerus, without significant bony tenderness ?Upper extremities appear neurovascularly intact  ?Skin: ?   General:  Skin is warm and dry.  ?   Capillary Refill: Capillary refill takes less than 2 seconds.  ?Neurological:  ?   General: No focal deficit present.  ?   Mental Status: She is alert and oriented to person, place, and time.  ?   GCS: GCS eye subscore is 4. GCS verbal subscore is 5. GCS motor subscore is 6.  ?   Cranial Nerves: No cranial nerve deficit, dysarthria or facial asymmetry.  ?   Sensory: Sensation is intact. No sensory deficit.  ?   Motor: No weakness, tremor or pronator drift.  ?   Coordination: Coordination normal. Finger-Nose-Finger Test and Heel to [employer] Test normal.  ?   Comments: Gait not assessed due to pt's poor ambulation at baseline  ?Psychiatric:     ?   Mood and Affect: Mood normal.  ? ? ?ED Results / Procedures / Treatments   ?Labs ?(all labs ordered are listed, but only abnormal results are displayed) ?Labs Reviewed - No data to display ? ? ?EKG ?EKG Interpretation ? ?Date/Time:  Sunday September 05 2021 62:83:15 EDT ?Ventricular Rate:  91 ?PR Interval:    ?QRS Duration: 79 ?QT Interval:  344 ?QTC Calculation: 424 ?R Axis:   70 ?Text Interpretation: Age not entered, assumed to be  81 years old for purpose of ECG interpretation Atrial fibrillation Borderline T abnormalities, anterior leads Confirmed by Octaviano Glow [phone]) on 09/05/2021 8:38:36 PM ? ?Radiology ?CT Head Wo Contrast ? ?Result Date: 09/05/2021 ?CLINICAL DATA:  Head trauma fall EXAM: CT HEAD WITHOUT CONTRAST TECHNIQUE: Contiguous axial images were obtained from the base of the skull through the vertex without intravenous contrast. RADIATION DOSE REDUCTION: This exam was performed according to the departmental dose-optimization program which includes automated exposure control, adjustment of the mA and/or kV according to patient size and/or use of iterative reconstruction technique. COMPARISON:  CT brain 12/29/2020 FINDINGS: Brain: No acute territorial infarction, hemorrhage, or intracranial mass. The ventricles are nonenlarged. Vascular: No  hyperdense vessels.  Carotid vascular calcification Skull: Normal. Negative for fracture or focal lesion. Sinuses/Orbits: No acute finding. Other: None IMPRESSION: Negative non contrasted CT appearance of the brain for age Electronically Signed   By: Donavan Foil M.D.   On: 09/05/2021 19:37   ? ?Procedures ?Procedures  ? ? ?Medications Ordered in ED ?Medications - No data to display ? ?ED Course/ Medical Decision Making/ A&P ?Clinical Course as of 09/05/21 2040  ?Nancy Fetter Sep 05, 2021  ?2022 This is  a 81 year old female with a history of vertigo, balance difficulty which is chronic, on a blood thinner, presenting from home with a fall and head injury.  She reports that she lost her balance when turning suddenly to the left today and fell and struck her head on the ground.  No loss of consciousness.  She feels back to normal in the ER.  Her family member at the bedside confirms that she is behaving normally, the patient is able to ambulate in the ED with use of her walker.  Her CT scan thankfully does not show any acute intracranial injury.  I do not see evidence of any other significant traumatic injuries on exam, she did land on her left elbow but does not have any significant swelling or tenderness, and is full range of motion of the left upper extremity.  I have low suspicion for pelvic fracture.  Do not believe that we need blood test at this time as she is back to baseline and this sounds like a mechanical fall related to peripheral vertigo.  She is stable for discharge home. [MT]  ?  ?Clinical Course User Index ?[MT] Wyvonnia Dusky, MD  ? ?                        ?Medical Decision Making ?Amount and/or Complexity of Data Reviewed ?External Data Reviewed: notes. ?Labs: ordered. Decision-making details documented in ED Course. ?Radiology: ordered and independent interpretation performed. Decision-making details documented in ED Course. ?ECG/medicine tests: ordered and independent interpretation performed.  Decision-making details documented in ED Course. ? ?Risk ?OTC drugs. ?Prescription drug management. ? ? ?81 y.o. female presents to the ED for concern of Fall.  This involves an extensive number of treatment optio

## 2021-09-05 NOTE — ED Triage Notes (Signed)
Pt arrived via POV with family, states just fell at home. States she got dizzy and fell. Hit head and left shoulder on tile floor. States "room went dark" for a moment.  ? ?On eliquis.  ?

## 2021-09-05 NOTE — Discharge Instructions (Signed)
Your CT imaging does not indicated an acute head bleed.  You may follow up with your primary care within the next 5-7 days for re-evaluation.  If you do not have a PCP, a local office has been provided for you to contact. ? ?Return to the ED for new or worsening symptoms as discussed.   ?

## 2021-09-09 ENCOUNTER — Other Ambulatory Visit: Payer: Self-pay | Admitting: Cardiology

## 2021-09-09 DIAGNOSIS — I4821 Permanent atrial fibrillation: Secondary | ICD-10-CM

## 2021-09-09 NOTE — Telephone Encounter (Signed)
Prescription refill request for Eliquis received. ?Indication: Afib  ?Last office visit: 04/01/21 Marlou Porch) ?Scr:0.89 (08/18/21) ?Age: 81 ?Weight: 61.2kg ? ?Appropriate dose and refill sent to requested pharmacy.  ?

## 2021-10-11 ENCOUNTER — Telehealth: Payer: Self-pay | Admitting: Cardiology

## 2021-10-11 NOTE — Telephone Encounter (Signed)
? ?  Pre-operative Risk Assessment  ?  ?Patient Name: DEANNA WIATER  ?DOB: [birth year] ?MRN: [id number]  ? ? ? ?Request for Surgical Clearance   ? ?Procedure:  Dental Extraction - Amount of Teeth to be Pulled:  2 (or 8 ) ? ?Date of Surgery:  Clearance TBD                              ?   ?Surgeon:  Dr. Particia Nearing ?Surgeon's Group or Practice Name:  [facility] ?Phone number:  [phone] ?Fax number:  [phone] ?  ?Type of Clearance Requested:   ?- Medical  ?- Pharmacy:  Hold Apixaban (Eliquis) TBD by Cardiology ?  ?Type of Anesthesia:  Local  ?  ?Additional requests/questions:   Patient will definitely need 2 teeth pulled but may need 8 pulled. If the patient were to have 8 teeth extracted , would the clearance change if the patient had them done all at once versus having two teeth extracted at each procedure ? ?Signed, ?Johnna Acosta   ?10/11/2021, 4:46 PM   ?

## 2021-10-12 NOTE — Telephone Encounter (Signed)
? ?  Patient Name: Kayla Booth  ?DOB: 01-06-1941 ?MRN: [id number] ? ?Primary Cardiologist: Candee Furbish, MD ? ?Chart reviewed as part of pre-operative protocol coverage.  ? ?Preoperative coverage team, please notify requesting office that simple dental extractions (i.e. 1-2 teeth) are considered low risk procedures per guidelines and generally do not require any specific cardiac clearance. It is also generally accepted that for simple extractions and dental cleanings, there is no need to interrupt blood thinner therapy.  Please have requested the office update surgical clearance request to reflect the exact number of teeth to be pulled.  ? ? ?SBE prophylaxis is not required for the patient from a cardiac standpoint. ? ? ?Please call with questions. ? ?Thank you.  ? ?Lenna Sciara, NP ?10/12/2021, 4:49 PM ? ?

## 2021-10-13 ENCOUNTER — Telehealth: Payer: Self-pay | Admitting: *Deleted

## 2021-10-13 NOTE — Telephone Encounter (Signed)
Patient with diagnosis of afib on Eliquis for anticoagulation.   ? ?Procedure:  Dental Extraction - Amount of Teeth to be Pulled:  2 (or 8 ) ?Date of procedure: TBD ? ?CHA2DS2-VASc Score = 5  ? This indicates a 7.2% annual risk of stroke. ?The patient's score is based upon: ?CHF History: 0 ?HTN History: 1 ?Diabetes History: 0 ?Stroke History: 0 ?Vascular Disease History: 1 ?Age Score: 2 ?Gender Score: 1 ?  ?  ? ?CrCl 50 ml/min ? ?Patient does NOT require pre-op antibiotics for dental procedure. ? ?If patient were to have just 2 teeth (1-2 teeth) pulled at once, she would not be required to hold anticoagulation. If there is a possibility that >2 teeth will be pulled, then patient may hold Eliquis for 1 day prior to procedure. ? ? ?

## 2021-10-13 NOTE — Telephone Encounter (Signed)
Pt agreeable to plan of care for tele pre op appt. Pt requested tomorrow in the afternoon. Pt appt 10/14/21 @ 4 pm. See notes that pt was trying to decide if she was going to only have 2 teeth extracted at this time or all 8 teeth at this time. Pt tells me that she decided she is going to only have 2 teeth extracted at this time.  ?Med rec and consent are done.  ? ?  ?Patient Consent for Virtual Visit  ? ? ?   ? ?Kayla Booth has provided verbal consent on 10/13/2021 for a virtual visit (video or telephone). ? ? ?CONSENT FOR VIRTUAL VISIT FOR:  Kayla Booth  ?By participating in this virtual visit I agree to the following: ? ?I hereby voluntarily request, consent and authorize [facility] and its employed or contracted physicians, physician assistants, nurse practitioners or other licensed health care professionals (the Practitioner), to provide me with telemedicine health care services (the ?Services") as deemed necessary by the treating Practitioner. I acknowledge and consent to receive the Services by the Practitioner via telemedicine. I understand that the telemedicine visit will involve communicating with the Practitioner through live audiovisual communication technology and the disclosure of certain medical information by electronic transmission. I acknowledge that I have been given the opportunity to request an in-person assessment or other available alternative prior to the telemedicine visit and am voluntarily participating in the telemedicine visit. ? ?I understand that I have the right to withhold or withdraw my consent to the use of telemedicine in the course of my care at any time, without affecting my right to future care or treatment, and that the Practitioner or I may terminate the telemedicine visit at any time. I understand that I have the right to inspect all information obtained and/or recorded in the course of the telemedicine visit and may receive copies of available information for a  reasonable fee.  I understand that some of the potential risks of receiving the Services via telemedicine include:  ?Delay or interruption in medical evaluation due to technological equipment failure or disruption; ?Information transmitted may not be sufficient (e.g. poor resolution of images) to allow for appropriate medical decision making by the Practitioner; and/or  ?In rare instances, security protocols could fail, causing a breach of personal health information. ? ?Furthermore, I acknowledge that it is my responsibility to provide information about my medical history, conditions and care that is complete and accurate to the best of my ability. I acknowledge that Practitioner's advice, recommendations, and/or decision may be based on factors not within their control, such as incomplete or inaccurate data provided by me or distortions of diagnostic images or specimens that may result from electronic transmissions. I understand that the practice of medicine is not an exact science and that Practitioner makes no warranties or guarantees regarding treatment outcomes. I acknowledge that a copy of this consent can be made available to me via my patient portal ([facility]), or I can request a printed copy by calling the office of [facility].   ? ?I understand that my insurance will be billed for this visit.  ? ?I have read or had this consent read to me. ?I understand the contents of this consent, which adequately explains the benefits and risks of the Services being provided via telemedicine.  ?I have been provided ample opportunity to ask questions regarding this consent and the Services and have had my questions answered to my satisfaction. ?I give my  informed consent for the services to be provided through the use of telemedicine in my medical care ? ? ? ?

## 2021-10-13 NOTE — Telephone Encounter (Signed)
Pt agreeable to plan of care for tele pre op appt. Pt requested tomorrow in the afternoon. Pt appt 10/14/21 @ 4 pm. See notes that pt was trying to decide if she was going to only have 2 teeth extracted at this time or all 8 teeth at this time. Pt tells me that she decided she is going to only have 2 teeth extracted at this time.  ?Med rec and consent are done.  ? ? ? ? ?

## 2021-10-13 NOTE — Telephone Encounter (Signed)
? ? ?  Name: Kayla Booth  ?DOB: 1940/09/20  ?MRN: [id number] ? ?Primary Cardiologist: Candee Furbish, MD ? ? ?Preoperative team, please contact this patient and set up a phone call appointment for further preoperative risk assessment. Please obtain consent and complete medication review. Thank you for your help. ? ?I confirm that guidance regarding antiplatelet and oral anticoagulation therapy has been completed and, if necessary, noted below. ? ?Patient with diagnosis of afib on Eliquis for anticoagulation.   ?  ?Procedure:  Dental Extraction - Amount of Teeth to be Pulled:  2 (or 8 ) ?Date of procedure: TBD ?  ?CHA2DS2-VASc Score = 5  ? This indicates a 7.2% annual risk of stroke. ?The patient's score is based upon: ?CHF History: 0 ?HTN History: 1 ?Diabetes History: 0 ?Stroke History: 0 ?Vascular Disease History: 1 ?Age Score: 2 ?Gender Score: 1 ?  ?  ?  ?CrCl 50 ml/min ?  ?Patient does NOT require pre-op antibiotics for dental procedure. ?  ?If patient were to have just 2 teeth (1-2 teeth) pulled at once, she would not be required to hold anticoagulation. If there is a possibility that >2 teeth will be pulled, then patient may hold Eliquis for 1 day prior to procedure. ? ?Lenna Sciara, NP ?10/13/2021, 10:24 AM ?[facility] ?[address] ?[place], [place] [address] ? ? ?

## 2021-10-13 NOTE — Telephone Encounter (Signed)
Left message for the pt to call back to schedule a tele pre op appt 

## 2021-10-13 NOTE — Telephone Encounter (Signed)
I s/w the dental office this morning to clarify how many teeth are going to be extracted. Per the dental office the pt is trying to decide if she wants to just have 2 teeth at this time extracted or if she is going to have 8 teeth extracted. The dental office is aware the recommendations for the blood thinner will change.  ? ?The dental office is asking if we can give what the recommendations are for both scenarios.  ? ?See section additional request/questions: Patient will definitely need 2 teeth pulled but may need 8 pulled. If the patient were to have 8 teeth extracted , would the clearance change if the patient had them done all at once versus having two teeth extracted at each procedure ?  ?I assured the dental office that I will have the pre op provider and pre op pharm-d review and notate recommendations if having 8 teeth extracted.  ?

## 2021-10-13 NOTE — Telephone Encounter (Signed)
Pt is returning call.  

## 2021-10-15 ENCOUNTER — Ambulatory Visit (INDEPENDENT_AMBULATORY_CARE_PROVIDER_SITE_OTHER): Payer: Medicare HMO | Admitting: Physician Assistant

## 2021-10-15 DIAGNOSIS — Z01810 Encounter for preprocedural cardiovascular examination: Secondary | ICD-10-CM | POA: Diagnosis not present

## 2021-10-15 NOTE — Progress Notes (Signed)
Virtual Visit via Telephone Note   Because of Kayla Booth's co-morbid illnesses, she is at least at moderate risk for complications without adequate follow up.  This format is felt to be most appropriate for this patient at this time.  The patient did not have access to video technology/had technical difficulties with video requiring transitioning to audio format only (telephone).  All issues noted in this document were discussed and addressed.  No physical exam could be performed with this format.  Please refer to the patient's chart for her consent to telehealth for [facility].  Evaluation Performed:  Preoperative cardiovascular risk assessment _____________   Date:  10/15/2021   Patient ID:  Kayla Booth, Kayla Booth 16-Jan-1941, MRN [id number] Patient Location:  Home Provider location:   Office  Primary Care Provider:  Pcp, No Primary Cardiologist:  Candee Furbish, MD  Chief Complaint / Patient Profile   81 y.o. y/o female with a h/o HTN, HLD, longstanding persistent, heriditary spherocytosis, abnormal LFTs, coronary and aortic calcification who is pending multiple dental extractions and presents today for telephonic preoperative cardiovascular risk assessment.  Past Medical History    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[place])    HTN (hypertension)    Hyperlipidemia    Longstanding persistent atrial fibrillation ([place]) 12/16/2017   Past Surgical History:  Procedure Laterality Date   CARDIOVERSION N/A 01/19/2018   Procedure: CARDIOVERSION;  Surgeon: Josue Hector, MD;  Location: [facility] ENDOSCOPY;  Service: Cardiovascular;  Laterality: N/A;   SPLENECTOMY      Allergies  Allergies  Allergen Reactions   Aspirin Rash    History of Present Illness    Kayla Booth is a 81 y.o. female who presents via audio/video conferencing for a telehealth  visit today. Prior echo 2019 EF 50-55%, no RWMA, mild AI. No prior ischemic heart disease or severe valvular disesae noted. Pt was last seen in cardiology clinic on 04/01/21 by Dr. Marlou Porch.  At that time Kayla Booth was doing well though had had a fall. It does appear she had 2 more falls in 08/19/21 and 09/05/21 with notes in system. The patient is now pending procedure as outlined above. Since her last visit, she denies any recent cardiac symptoms. No chest pain, SOB, palpitations, syncope. She states the falls were purely mechanical, that she tends to move slow and doesn't have great balance. She has fallen at least 4-5 times in the last 6 months. She does not have a PCP but has been planning to call the [facility] family [facility] clinic. She walks with a walker.   Home Medications    Prior to Admission medications   Medication Sig Start Date End Date Taking? Authorizing Provider  acetaminophen (TYLENOL 8 HOUR) 650 MG CR tablet Take 1 tablet (650 mg total) by mouth every 8 (eight) hours as needed for pain. 09/04/19   Tasia Catchings, Amy V, PA-C  cholecalciferol (VITAMIN D) 400 units TABS tablet Take 400 Units by mouth daily.    [provider]  diclofenac Sodium (VOLTAREN) 1 % GEL Apply 4 g topically 4 (four) times daily. 09/04/19   Yu, Amy V, PA-C  ELIQUIS 5 MG TABS tablet TAKE 1 TABLET BY MOUTH TWICE DAILY 09/09/21   Jerline Pain, MD  lidocaine (LIDODERM) 5 % Place 1 patch onto the skin daily. Remove & Discard patch within 12 hours or as  directed by MD 09/04/19   Ok Edwards, PA-C  metoprolol tartrate (LOPRESSOR) 25 MG tablet TAKE 1 TABLET BY MOUTH TWICE A DAY 05/21/21   Jerline Pain, MD  Omega-3 Fatty Acids (OMEGA-3 PO) Take 520 mg by mouth daily.    [provider]  vitamin B-12 (CYANOCOBALAMIN) 1000 MCG tablet Take 1,000 mcg by mouth daily.    [provider]    Physical Exam    Vital Signs:  Kayla Booth does not have vital signs available for review today.  Given telephonic nature  of communication, physical exam is limited. AAOx3. NAD. Normal affect.  Speech and respirations are unlabored.  Accessory Clinical Findings    None  Assessment & Plan    1.  Preoperative Cardiovascular Risk Assessment: We are asked to hold Eliquis by her dental team for dental extraction. The request was for 2 or 8 teeth. The patient is not quite sure yet which she will pursue but she thinks she will only plan to have 2 taken out. From a medical standpoint there is no cardiac contraindication to proceeding with procedure.  The next question was regarding Eliquis. When reviewing chart I became concerned about her recent ER visits earlier this spring (07/2021 for rectal bleeding, 07/2021 for fall, 08/2021 for fall) therefore I reached out to Dr. Marlou Porch for input. At this time he is concerned about her falling multiple times and he recommends to discontinue Eliquis completely. I relayed this recommendation to the patient who verbalized understanding. We discussed stroke risk off anticoagulation, as it is not an easy situation. I told her I would send a message to our scheduling team to get her in for an in-office visit in the next few weeks for follow-up and also review additional options (unclear whether she would be a Watchman, ablation type of candidate). I also encouraged her to call primary care to get established as she was planning to do. We also reviewed ER precautions.   SBE prophylaxis is not required for the patient from a cardiac standpoint.  A copy of this note will be routed to requesting surgeon.  Time:   Today, I have spent 6 minutes with the patient with telehealth technology discussing medical history, symptoms, and management plan.     Charlie Pitter, PA-C  10/15/2021, 1:49 PM

## 2021-10-18 ENCOUNTER — Ambulatory Visit: Payer: Medicare HMO | Admitting: Physician Assistant

## 2021-10-18 ENCOUNTER — Encounter: Payer: Self-pay | Admitting: Physician Assistant

## 2021-10-18 VITALS — BP 138/64 | HR 90 | Ht 60.0 in | Wt 130.2 lb

## 2021-10-18 DIAGNOSIS — I4811 Longstanding persistent atrial fibrillation: Secondary | ICD-10-CM | POA: Diagnosis not present

## 2021-10-18 DIAGNOSIS — I10 Essential (primary) hypertension: Secondary | ICD-10-CM | POA: Diagnosis not present

## 2021-10-18 DIAGNOSIS — R296 Repeated falls: Secondary | ICD-10-CM

## 2021-10-18 DIAGNOSIS — I700 Atherosclerosis of aorta: Secondary | ICD-10-CM

## 2021-10-18 NOTE — Progress Notes (Signed)
Cardiology Office Note:    Date:  10/18/2021   ID:  Kayla, Booth 1941-03-15, MRN [id number]  PCP:  Pcp, No  [facility] Providers Cardiologist:  Candee Furbish, MD     Referring MD: No ref. provider found   Chief Complaint:  Discuss anticoagulation    Patient Profile: Persistent atrial fibrillation  S/p failed DCCV in the past  Anticoag DCd in May 2023 due to freq falls Coronary artery calcification Aortic atherosclerosis  Hypertension  Hyperlipidemia  Frequent falls Hereditary spherocytosis   Prior CV Studies: ECHO COMPLETE WO IMAGING ENHANCING AGENT 12/17/2017 EF 50-55, no RWMA, mild AI, trivial MR, normal RVSF, trivial TR, PASP 22   History of Present Illness:   Kayla Booth is a 81 y.o. female with the above problem list.  She was last seen by Dr. Marlou Porch in Nov 2022.  She had a recent telemedicine (telephone) visit with Melina Copa, PA-C on Oct 15, 2021 for surgical clearance.  She needs multiple dental extractions and the request was to hold Eliquis. It was noted the patient had a recent hx of frequent falls as well as a visit to the ED for rectal bleeding in the setting of constipation and a hard BM in March 2023.  Her hgb was normal at that time.   In March, she required stitches for a laceration on her arm caused by her fall.  She struck her head in April from a fall but the CT in the ED did not show a bleed.  Of note, she also had a trip to the ED from a fall in Aug 2022.  Her head CT at that time also was neg for a bleed.  Kayla Booth reviewed her case with Dr. Marlou Porch who recommended the patient stop Apixaban.    She is seen today for f/u.  She is here with her son.  She has been doing well since her phone call with Kayla Booth 3 days ago.  She has not taken Eliquis since then.  She did not understand completely and thought she was to resume her Eliquis after her dental extraction.  She has not had chest pain, shortness of breath, syncope, orthopnea.  She has R ankle edema that  resolves with elevation.      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[place])    HTN (hypertension)    Hyperlipidemia    Persistent atrial fibrillation ([place]) 12/16/2017   Eliquis DCd in May 2023 due to frequent falls   Current Medications: Current Meds  Medication Sig   acetaminophen (TYLENOL 8 HOUR) 650 MG CR tablet Take 1 tablet (650 mg total) by mouth every 8 (eight) hours as needed for pain.   cholecalciferol (VITAMIN D) 400 units TABS tablet Take 400 Units by mouth daily.   metoprolol tartrate (LOPRESSOR) 25 MG tablet TAKE 1 TABLET BY MOUTH TWICE A DAY   Omega-3 Fatty Acids (OMEGA-3 PO) Take 520 mg by mouth daily.   vitamin B-12 (CYANOCOBALAMIN) 1000 MCG tablet Take 1,000 mcg by mouth daily.   [DISCONTINUED] Apixaban (ELIQUIS PO) Take 1 tablet by mouth 2 (two) times daily.    Allergies:   Aspirin   Social History   Tobacco Use   Smoking status: Never   Smokeless tobacco: Never  Vaping Use   Vaping Use: Never used  Substance Use Topics   Alcohol use: No   Drug  use: No    Family Hx: The patient's family history includes Diabetes in her mother; Heart failure in an other family member; Stroke in her mother.  Review of Systems  Gastrointestinal:  Negative for hematochezia.  Genitourinary:  Negative for hematuria.    EKGs/Labs/Other Test Reviewed:    EKG:  EKG is not ordered today.  The ekg ordered today demonstrates n/a  Recent Labs: 04/01/2021: TSH 3.530 08/18/2021: ALT 17; BUN 11; Creatinine, Ser 0.89; Hemoglobin 13.8; Platelets 555; Potassium 3.6; Sodium 140   Recent Lipid Panel Recent Labs    04/01/21 0942  CHOL 210*  TRIG 113  HDL 62  LDLCALC 128*     Risk Assessment/Calculations:    CHA2DS2-VASc Score = 5   This indicates a 7.2% annual risk of stroke. The patient's score is based upon: CHF History: 0 HTN History:  1 Diabetes History: 0 Stroke History: 0 Vascular Disease History: 1 Age Score: 2 Gender Score: 1        Physical Exam:    VS:  BP 138/64   Pulse 90   Ht 5' (1.524 m)   Wt 130 lb 3.2 oz (59.1 kg)   SpO2 97%   BMI 25.43 kg/m     Wt Readings from Last 3 Encounters:  10/18/21 130 lb 3.2 oz (59.1 kg)  08/31/21 135 lb (61.2 kg)  08/18/21 145 lb 15.1 oz (66.2 kg)    Constitutional:      Appearance: Healthy appearance. Not in distress.  Pulmonary:     Effort: Pulmonary effort is normal.     Breath sounds: No wheezing. No rales.  Cardiovascular:     Normal rate. Irregularly irregular rhythm. Normal S1. Normal S2.      Murmurs: There is no murmur.  Edema:    Peripheral edema present.    Ankle: trace edema of the right ankle. Abdominal:     Palpations: Abdomen is soft.  Musculoskeletal:     Cervical back: Neck supple. Skin:    General: Skin is warm and dry.  Neurological:     Mental Status: Alert and oriented to person, place and time.     Cranial Nerves: Cranial nerves are intact.         ASSESSMENT & PLAN:   Longstanding persistent atrial fibrillation (HCC) Rate is well controlled.  CHA2DS2-VASc Score = 5 [CHF History: 0, HTN History: 1, Diabetes History: 0, Stroke History: 0, Vascular Disease History: 1, Age Score: 2, Gender Score: 1].  Therefore, the patient's annual risk of stroke is 7.2 %. HAS-BLED score is low but, she has had frequent falls in the last 6 mos.  She has been to the ED several times.  She has hit her head and required a head CT on 2 occasions.  She had no bleed on either study.  She notes that she has fallen several other times at home and did not have to seek medical attention.  Her primary cardiologist, Dr. Marlou Porch has recommended that she stop anticoagulation given her high risk of bleeding in the setting of frequent falls.  I did touch base with Dr. Marlou Porch today via [place].  He does not feel that she is a candidate for LAA closure device.  I explained  all of this to the patient and she knows that she is to remain off of Eliquis going forward.  F/u with Dr. Marlou Porch in 6 mos.   Aortic atherosclerosis (HCC) We discussed possibly starting ASA 81 mg at some point once we can confirm she  is no longer having any rectal bleeding etc.  However, she is allergic to ASA. Continue current Rx.   HTN (hypertension) BP is controlled.  Continue metoprolol tartrate 25 mg twice daily.    Frequent falls She uses a walker most of the time.  As noted, she has had multiple falls.  Luckily, she has not had any intracranial bleeds or broken bones.             Dispo:  Return in about 6 months (around 04/20/2022) for Routine Follow Up w/ Dr. Marlou Porch.   Medication Adjustments/Labs and Tests Ordered: Current medicines are reviewed at length with the patient today.  Concerns regarding medicines are outlined above.  Tests Ordered: No orders of the defined types were placed in this encounter.  Medication Changes: No orders of the defined types were placed in this encounter.  Signed, Richardson Dopp, PA-C  10/18/2021 2:36 PM    [facility] [address], [address], [place]  [address] Phone: [phone]; Fax: [phone]

## 2021-10-18 NOTE — Assessment & Plan Note (Signed)
She uses a walker most of the time.  As noted, she has had multiple falls.  Luckily, she has not had any intracranial bleeds or broken bones.

## 2021-10-18 NOTE — Patient Instructions (Signed)
Medication Instructions:  Your physician recommends that you continue on your current medications as directed. Please refer to the Current Medication list given to you today.  *If you need a refill on your cardiac medications before your next appointment, please call your pharmacy*   Lab Work: None ordered  If you have labs (blood work) drawn today and your tests are completely normal, you will receive your results only by: [place] (if you have MyChart) OR A paper copy in the mail If you have any lab test that is abnormal or we need to change your treatment, we will call you to review the results.   Testing/Procedures: None ordered   Follow-Up: At [facility], you and your health needs are our priority.  As part of our continuing mission to provide you with exceptional heart care, we have created designated Provider Care Teams.  These Care Teams include your primary Cardiologist (physician) and Advanced Practice Providers (APPs -  Physician Assistants and Nurse Practitioners) who all work together to provide you with the care you need, when you need it.  We recommend signing up for the patient portal called "MyChart".  Sign up information is provided on this After Visit Summary.  MyChart is used to connect with patients for Virtual Visits (Telemedicine).  Patients are able to view lab/test results, encounter notes, upcoming appointments, etc.  Non-urgent messages can be sent to your provider as well.   To learn more about what you can do with MyChart, go to [email or web address].    Your next appointment:   04/25/22 arrive at 1:45   The format for your next appointment:   In Person  Provider:   Candee Furbish, MD     Other Instructions   Important Information About Sugar

## 2021-10-18 NOTE — Assessment & Plan Note (Signed)
We discussed possibly starting ASA 81 mg at some point once we can confirm she is no longer having any rectal bleeding etc.  However, she is allergic to ASA. Continue current Rx.

## 2021-10-18 NOTE — Assessment & Plan Note (Signed)
Rate is well controlled.  CHA2DS2-VASc Score = 5 [CHF History: 0, HTN History: 1, Diabetes History: 0, Stroke History: 0, Vascular Disease History: 1, Age Score: 2, Gender Score: 1].  Therefore, the patient's annual risk of stroke is 7.2 %. HAS-BLED score is low but, she has had frequent falls in the last 6 mos.  She has been to the ED several times.  She has hit her head and required a head CT on 2 occasions.  She had no bleed on either study.  She notes that she has fallen several other times at home and did not have to seek medical attention.  Her primary cardiologist, Dr. Marlou Porch has recommended that she stop anticoagulation given her high risk of bleeding in the setting of frequent falls.  I did touch base with Dr. Marlou Porch today via [place].  He does not feel that she is a candidate for LAA closure device.  I explained all of this to the patient and she knows that she is to remain off of Eliquis going forward.  F/u with Dr. Marlou Porch in 6 mos.

## 2021-10-18 NOTE — Assessment & Plan Note (Signed)
BP is controlled.  Continue metoprolol tartrate 25 mg twice daily.

## 2022-02-17 ENCOUNTER — Other Ambulatory Visit: Payer: Self-pay | Admitting: Cardiology

## 2022-04-25 ENCOUNTER — Encounter: Payer: Self-pay | Admitting: Cardiology

## 2022-04-25 ENCOUNTER — Ambulatory Visit: Payer: Medicare HMO | Attending: Cardiology | Admitting: Cardiology

## 2022-04-25 VITALS — BP 125/80 | HR 64 | Ht 60.0 in | Wt 131.2 lb

## 2022-04-25 DIAGNOSIS — I700 Atherosclerosis of aorta: Secondary | ICD-10-CM | POA: Diagnosis not present

## 2022-04-25 DIAGNOSIS — I10 Essential (primary) hypertension: Secondary | ICD-10-CM | POA: Diagnosis not present

## 2022-04-25 DIAGNOSIS — I4811 Longstanding persistent atrial fibrillation: Secondary | ICD-10-CM | POA: Diagnosis not present

## 2022-04-25 NOTE — Progress Notes (Signed)
Cardiology Office Note:    Date:  04/25/2022   ID:  Kayla Booth, Kayla Booth 12-08-1940, MRN [id number]  PCP:  Pcp, No  [facility] Cardiologist:  Candee Furbish, MD  [place] Electrophysiologist:  None   Referring MD: No ref. provider found    History of Present Illness:    Kayla Booth is a 81 y.o. female with PMHx of HTN, HLD, and longstanding persistent atrial fibrillation here for the follow-up of hypertension and atrial fibrillation.  She was seen in the ED 12/29/2020 following a mechanical fall in her home, backwards, with head trauma after hitting a dresser and associated chest pain. Stable chest and pelvic xrays with no acute fractures, and tylenol was given. She followed up with urgent care on 01/08/2021, and reported some mid chest pain that is mild and occurs when rolling over in bed.  Last seen by Vin on 02/21/2020.  Back in July 2019 she was admitted with pneumonia.  Had atrial fibrillation with RVR at that point.  EF was 55% with mild aortic regurgitation.  Elevated ALTs noted. At that time felt like the atrial fibrillation was triggered by pneumonia.  Atrial fibrillation had returned.  She was asymptomatic.  She had failed cardioversion attempt multiple times.  Overall maintaining rate control.  On metoprolol. Cost of Eliquis has been high for her.  She was able to receive some assistance earlier.  At her last visit with me on 04/01/2021, she reported a head injury with associated swelling following her fall. She also noted brief intermittent chest pain at times.  She was seen in the ED 4 more times in March/April following falls. She was last seen by PA Weaver on 10/18/21 and her Eliquis was discontinued given her recurrent falls and bleeding risks.  Today, she states that she is doing okay. She is accompanied by her son. She sleeps often throughout the day because she sleeps poorly at night.   She states that she went over a year without a fall prior to her ED visit in  April 2023. She has been using a walker consistently.  We decided to stop Eliquis because of risks of fall.  She denies any palpitations, chest pain, shortness of breath, or peripheral edema. No lightheadedness, headaches, syncope, orthopnea, or PND.  She is planning to have some teeth pulled and eventually get dentures.  She asked for recommendations to establish primary care.   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[place])    HTN (hypertension)    Hyperlipidemia    Persistent atrial fibrillation ([place]) 12/16/2017   Eliquis DCd in May 2023 due to frequent falls    Past Surgical History:  Procedure Laterality Date   CARDIOVERSION N/A 01/19/2018   Procedure: CARDIOVERSION;  Surgeon: Josue Hector, MD;  [facility];  Service: Cardiovascular;  Laterality: N/A;   SPLENECTOMY      Current Medications: Current Meds  Medication Sig   acetaminophen (TYLENOL 8 HOUR) 650 MG CR tablet Take 1 tablet (650 mg total) by mouth every 8 (eight) hours as needed for pain.   cholecalciferol (VITAMIN D) 400 units TABS tablet Take 400 Units by mouth daily.   metoprolol tartrate (LOPRESSOR) 25 MG tablet TAKE 1 TABLET BY MOUTH TWICE A DAY   Omega-3 Fatty Acids (OMEGA-3 PO) Take 520 mg by mouth daily.   vitamin B-12 (CYANOCOBALAMIN) 1000 MCG tablet Take  1,000 mcg by mouth daily.     Allergies:   Aspirin   Social History   Socioeconomic History   Marital status: Divorced    Spouse name: Not on file   Number of children: Not on file   Years of education: Not on file   Highest education level: Not on file  Occupational History   Not on file  Tobacco Use   Smoking status: Never   Smokeless tobacco: Never  Vaping Use   Vaping Use: Never used  Substance and Sexual Activity   Alcohol use: No   Drug use: No   Sexual activity: Not on file  Other  Topics Concern   Not on file  Social History Narrative   Not on file   Social Determinants of Health   Financial Resource Strain: Not on file  Food Insecurity: Not on file  Transportation Needs: Not on file  Physical Activity: Not on file  Stress: Not on file  Social Connections: Not on file     Family History: The patient's family history includes Diabetes in her mother; Heart failure in an other family member; Stroke in her mother.  ROS:   Please see the history of present illness. + Sleep disturbance All other systems are reviewed and negative.    EKGs/Labs/Other Studies Reviewed:    The following studies were reviewed today:  CT  Chest 12/18/2017: IMPRESSION: Peripheral, somewhat nodular airspace disease scattered throughout the lungs bilaterally. Density on the prior chest x-ray represents anterior pleural based airspace disease in the right middle lobe and lingula. Findings most compatible with infectious process, possibly multifocal pneumonia or indolent infection such as MAI. This could be followed with repeat chest CT in 3 months after treatment to ensure stability or regression of disease.   Trace right pleural effusion.   Cardiomegaly.   Scattered coronary artery and aortic calcifications.  Echocardiogram 12/17/2017:  - Left ventricle: The cavity size was normal. Systolic function was    normal. The estimated ejection fraction was in the range of 50%    to 55%. Wall motion was normal; there were no regional wall    motion abnormalities.  - Aortic valve: There was mild regurgitation.  - Mitral valve: There was trivial regurgitation.  - Right ventricle: Systolic function was normal.  - Atrial septum: No defect or patent foramen ovale was identified.  - Tricuspid valve: There was trivial regurgitation. Peak RV-RA    gradient (S): 19 mm Hg.  - Pulmonic valve: There was no significant regurgitation.  - Pulmonary arteries: Systolic pressure was within the  normal    range. PA peak pressure: 22 mm Hg (S).   Impressions:   - Atrial fibrillation throughout study. Normal LV function. Mild    AR, trivial MR/TR.   EKG: EKG is personally reviewed and interpreted. 04/01/2021: AFIB . Rate 89 bpm. Last 24/21-A. fib 89 no other abnormalities.  Recent Labs: 08/18/2021: ALT 17; BUN 11; Creatinine, Ser 0.89; Hemoglobin 13.8; Platelets 555; Potassium 3.6; Sodium 140   Recent Lipid Panel    Component Value Date/Time   CHOL 210 (H) 04/01/2021 0942   TRIG 113 04/01/2021 0942   HDL 62 04/01/2021 0942   CHOLHDL 3.4 04/01/2021 0942   LDLCALC 128 (H) 04/01/2021 0942       Physical Exam:    VS:  BP 125/80   Pulse 64   Ht 5' (1.524 m)   Wt 131 lb 3.2 oz (59.5 kg)   SpO2 96%   BMI 25.62  kg/m     Wt Readings from Last 3 Encounters:  04/25/22 131 lb 3.2 oz (59.5 kg)  10/18/21 130 lb 3.2 oz (59.1 kg)  08/31/21 135 lb (61.2 kg)     GEN: Well nourished, well developed in no acute distress HEENT: Normal, kyphosis NECK: No JVD; No carotid bruits LYMPHATICS: No lymphadenopathy CARDIAC: Irregular, no murmurs, rubs, gallops RESPIRATORY:  Subtle inspiratory wheeze heard bilaterally upon initial respiration. ABDOMEN: Soft, non-tender, non-distended MUSCULOSKELETAL:  No edema; No deformity  SKIN: Warm and dry NEUROLOGIC:  Alert and oriented x 3 PSYCHIATRIC:  Normal affect    ASSESSMENT:    1. Longstanding persistent atrial fibrillation ([place])   2. Aortic atherosclerosis ([place])   3. Primary hypertension      PLAN:    In order of problems listed above:  Longstanding persistent atrial fibrillation ([place]) Continue with good rate control, metoprolol at current dosing.  No changes made.  In the past has failed cardioversions.  Tolerating rate control atrial fibrillation well.  Stable no changes made.   Chronic anticoagulation Previously had been on Eliquis however she had several visits to the ER secondary to falls and hit her head requiring's  head CTs on several occasions.  Back in May 2023 we decided mutually to discontinue anticoagulation.  I talked with her and her son again.  We will continue to hold.  Understands potential risk of stroke.   Coronary artery calcification Continue with goal-directed medical therapy.  Ultimately would benefit from statin use.    Aortic atherosclerosis ([place]) As with coronary artery calcification, would benefit from statin use.  Currently taking metoprolol.  On omega-3.   Fall Several falls in the past.  She has been proud that she made it almost 1 year without fall until recently when trying to put her pants on.   We will try to assist in her finding a new primary care physician. With hair loss, we will check TSH and free T4. Encourage physical therapy.    Follow-up:  6 months with Nicki Reaper, 12 months with me  Medication Adjustments/Labs and Tests Ordered: Current medicines are reviewed at length with the patient today.  Concerns regarding medicines are outlined above.  No orders of the defined types were placed in this encounter.   No orders of the defined types were placed in this encounter.    Patient Instructions  Medication Instructions:  No changes. *If you need a refill on your cardiac medications before your next appointment, please call your pharmacy*   Lab Work: None. If you have labs (blood work) drawn today and your tests are completely normal, you will receive your results only by: [place] (if you have MyChart) OR A paper copy in the mail If you have any lab test that is abnormal or we need to change your treatment, we will call you to review the results.   Testing/Procedures: None.   Follow-Up: At [facility], you and your health needs are our priority.  As part of our continuing mission to provide you with exceptional heart care, we have created designated Provider Care Teams.  These Care Teams include your primary Cardiologist (physician) and  Advanced Practice Providers (APPs -  Physician Assistants and Nurse Practitioners) who all work together to provide you with the care you need, when you need it.  We recommend signing up for the patient portal called "MyChart".  Sign up information is provided on this After Visit Summary.  MyChart is used to connect with patients for  Virtual Visits (Telemedicine).  Patients are able to view lab/test results, encounter notes, upcoming appointments, etc.  Non-urgent messages can be sent to your provider as well.   To learn more about what you can do with MyChart, go to [email or web address].    Your next appointment:   6 month(s)  The format for your next appointment:   In Person  Provider:   Richardson Dopp, PA-C     Then, Candee Furbish, MD will plan to see you again in 1 year(s).     Important Information About Sugar          Signed, Candee Furbish, MD  04/25/2022 2:48 PM    [place] as a scribe for Candee Furbish, MD.,have documented all relevant documentation on the behalf of Candee Furbish, MD,as directed by  Candee Furbish, MD while in the presence of Candee Furbish, MD.  I, Candee Furbish, MD, have reviewed all documentation for this visit. The documentation on 04/25/22 for the exam, diagnosis, procedures, and orders are all accurate and complete.

## 2022-04-25 NOTE — Patient Instructions (Signed)
Medication Instructions:  No changes. *If you need a refill on your cardiac medications before your next appointment, please call your pharmacy*   Lab Work: None. If you have labs (blood work) drawn today and your tests are completely normal, you will receive your results only by: [place] (if you have MyChart) OR A paper copy in the mail If you have any lab test that is abnormal or we need to change your treatment, we will call you to review the results.   Testing/Procedures: None.   Follow-Up: At [facility], you and your health needs are our priority.  As part of our continuing mission to provide you with exceptional heart care, we have created designated Provider Care Teams.  These Care Teams include your primary Cardiologist (physician) and Advanced Practice Providers (APPs -  Physician Assistants and Nurse Practitioners) who all work together to provide you with the care you need, when you need it.  We recommend signing up for the patient portal called "MyChart".  Sign up information is provided on this After Visit Summary.  MyChart is used to connect with patients for Virtual Visits (Telemedicine).  Patients are able to view lab/test results, encounter notes, upcoming appointments, etc.  Non-urgent messages can be sent to your provider as well.   To learn more about what you can do with MyChart, go to [email or web address].    Your next appointment:   6 month(s)  The format for your next appointment:   In Person  Provider:   Richardson Dopp, PA-C     Then, Candee Furbish, MD will plan to see you again in 1 year(s).     Important Information About Sugar

## 2022-07-11 ENCOUNTER — Emergency Department (HOSPITAL_COMMUNITY): Payer: Medicare HMO

## 2022-07-11 ENCOUNTER — Encounter (HOSPITAL_COMMUNITY): Payer: Self-pay

## 2022-07-11 ENCOUNTER — Emergency Department (HOSPITAL_COMMUNITY)
Admission: EM | Admit: 2022-07-11 | Discharge: 2022-07-12 | Disposition: A | Discharge status: Home / Self Care | Payer: Medicare HMO | Attending: Emergency Medicine | Admitting: Emergency Medicine

## 2022-07-11 ENCOUNTER — Other Ambulatory Visit: Payer: Self-pay

## 2022-07-11 DIAGNOSIS — R079 Chest pain, unspecified: Secondary | ICD-10-CM | POA: Diagnosis not present

## 2022-07-11 DIAGNOSIS — W010XXA Fall on same level from slipping, tripping and stumbling without subsequent striking against object, initial encounter: Secondary | ICD-10-CM | POA: Insufficient documentation

## 2022-07-11 DIAGNOSIS — W19XXXA Unspecified fall, initial encounter: Secondary | ICD-10-CM

## 2022-07-11 DIAGNOSIS — I6782 Cerebral ischemia: Secondary | ICD-10-CM | POA: Insufficient documentation

## 2022-07-11 DIAGNOSIS — Y9301 Activity, walking, marching and hiking: Secondary | ICD-10-CM | POA: Insufficient documentation

## 2022-07-11 NOTE — ED Provider Triage Note (Signed)
Emergency Medicine Provider Triage Evaluation Note  Kayla Booth , a 82 y.o. female  was evaluated in triage.  Pt complains of chest wall pain.  Patient states she was walking towards her couch and tripped over her own foot when she was not using her walker.  Patient landed on the right side onto the hardwood.  Denies hitting head, loss of consciousness.  Denies dizziness, lightheadedness, shortness of breath before or after the fall.  Reports her chest feels mildly tender along her upper ribs.  Denies hip pain, leg pain, arm pain, shoulder pain.  Denies anticoagulation use.  Review of Systems  Positive:  Negative: See above  Physical Exam  BP (!) 163/77 (BP Location: Right Arm)   Pulse (!) 110   Temp 98.2 F (36.8 C)   Resp 18   Ht 5' (1.524 m)   Wt 56.7 kg   SpO2 97%   BMI 24.41 kg/m  Gen:   Awake, no distress   Resp:  Normal effort  MSK:   Moves extremities without difficulty  Other:  No tenderness, malrotation, bony deformity of RUE or RLE, pelvis  Medical Decision Making  Medically screening exam initiated at 9:53 PM.  Appropriate orders placed.  Kayla Booth was informed that the remainder of the evaluation will be completed by another provider, this initial triage assessment does not replace that evaluation, and the importance of remaining in the ED until their evaluation is complete.     Prince Rome, PA-C [id number]

## 2022-07-11 NOTE — ED Triage Notes (Signed)
Pt arrives with her son who reports the patient tripped over her own foot tonight when she was walking to her couch. She fell onto the hardwood and landed on her right side. No LOC, no blood thinners, denies hitting her head. She is reporting chest pain but denies hitting her chest on anything when she fell. Denies hip pain or any other injury.

## 2022-07-12 ENCOUNTER — Emergency Department (HOSPITAL_COMMUNITY): Payer: Medicare HMO

## 2022-07-12 MED ORDER — LIDOCAINE 5 % EX PTCH: 1.0000 | MEDICATED_PATCH | CUTANEOUS | 0 refills | Status: DC

## 2022-07-12 NOTE — ED Provider Notes (Signed)
[facility] Provider Note   CSN: [id number] Arrival date & time: 07/11/22  2123     History  Chief Complaint  Patient presents with   Kayla Booth GAIA TETRAULT is a 82 y.o. female.  The history is provided by the patient.  Fall This is a recurrent problem. The current episode started 3 to 5 hours ago. The problem occurs constantly. The problem has been resolved. Pertinent negatives include no abdominal pain, no headaches and no shortness of breath. Nothing aggravates the symptoms. Nothing relieves the symptoms. She has tried nothing for the symptoms. The treatment provided no relief.  Patient with recurrent AFIB no long on a DOAC due to frequent falls presents with fall. Patient said foot got stuck and caused her to fall.  Struck right side.      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 Authorizing Provider  acetaminophen (TYLENOL 8 HOUR) 650 MG CR tablet Take 1 tablet (650 mg total) by mouth every 8 (eight) hours as needed for pain. 09/04/19   Tasia Catchings, Amy V, PA-C  cholecalciferol (VITAMIN D) 400 units TABS tablet Take 400 Units by mouth daily.    [provider]  metoprolol tartrate (LOPRESSOR) 25 MG tablet TAKE 1 TABLET BY MOUTH TWICE A DAY 02/17/22   Jerline Pain, MD  Omega-3 Fatty Acids (OMEGA-3 PO) Take 520 mg by mouth daily.    [provider]  vitamin B-12 (CYANOCOBALAMIN) 1000 MCG tablet Take 1,000 mcg by mouth daily.    [provider]      Allergies    Aspirin    Review of Systems   Review of Systems  Constitutional:   Negative for fever.  HENT:  Negative for facial swelling.   Eyes:  Negative for redness.  Respiratory:  Negative for shortness of breath, wheezing and stridor.   Gastrointestinal:  Negative for abdominal pain.  Musculoskeletal:  Positive for arthralgias. Negative for back pain.  Neurological:  Negative for headaches.  All other systems reviewed and are negative.   Physical Exam Updated Vital Signs BP (!) 163/77 (BP Location: Right Arm)   Pulse (!) 110   Temp 98.2 F (36.8 C)   Resp 18   Ht 5' (1.524 m)   Wt 56.7 kg   SpO2 97%   BMI 24.41 kg/m  Physical Exam Vitals and nursing note reviewed.  Constitutional:      General: She is not in acute distress.    Appearance: She is well-developed.  HENT:     Head: Normocephalic and atraumatic.     Nose: Nose normal.  Eyes:     Pupils: Pupils are equal, round, and reactive to light.  Cardiovascular:     Rate and Rhythm: Normal rate and regular rhythm.     Pulses: Normal pulses.     Heart sounds: Normal heart sounds.  Pulmonary:     Effort: Pulmonary effort is normal. No respiratory distress.     Breath sounds: Normal breath sounds.  Abdominal:     General: Bowel sounds are normal. There is no  distension.     Palpations: Abdomen is soft.     Tenderness: There is no abdominal tenderness. There is no guarding or rebound.  Genitourinary:    Vagina: No vaginal discharge.  Musculoskeletal:        General: Normal range of motion.     Right shoulder: Normal.     Left shoulder: Normal.     Right wrist: Normal. No snuff box tenderness.     Left wrist: Normal. No snuff box tenderness.     Right hand: Normal.     Left hand: Normal.     Cervical back: Normal range of motion and neck supple. No rigidity or tenderness.     Thoracic back: Normal.     Right hip: Normal.     Left hip: Normal.     Right knee: No LCL laxity, MCL laxity, ACL laxity or PCL laxity. Normal pulse.     Left knee: No LCL laxity, MCL laxity, ACL laxity or PCL  laxity.Normal pulse.     Right lower leg: Normal.     Left lower leg: Normal.     Right ankle: Normal.     Right Achilles Tendon: Normal.     Left ankle: Normal.     Left Achilles Tendon: Normal.     Right foot: Normal.     Left foot: Normal.  Lymphadenopathy:     Cervical: No cervical adenopathy.  Skin:    General: Skin is dry.     Capillary Refill: Capillary refill takes less than 2 seconds.     Findings: No erythema or rash.  Neurological:     General: No focal deficit present.     Mental Status: She is alert.     Deep Tendon Reflexes: Reflexes normal.  Psychiatric:        Mood and Affect: Mood normal.     ED Results / Procedures / Treatments   Labs (all labs ordered are listed, but only abnormal results are displayed) Labs Reviewed - No data to display  EKG EKG Interpretation  Date/Time:  Monday July 11 2022 21:26:07 EST Ventricular Rate:  102 PR Interval:    QRS Duration: 58 QT Interval:  322 QTC Calculation: 419 R Axis:   62 Text Interpretation: Atrial fibrillation with rapid ventricular response Cannot rule out Anterior infarct , age undetermined Confirmed by Dory Horn) on 07/11/2022 11:21:38 PM  Radiology DG Hip Unilat W or Wo Pelvis 2-3 Views Right  Result Date: 07/12/2022 CLINICAL DATA:  Fall, right hip pain EXAM: DG HIP (WITH OR WITHOUT PELVIS) 2-3V RIGHT COMPARISON:  None Available. FINDINGS: There is no evidence of hip fracture or dislocation. There is no evidence of arthropathy or other focal bone abnormality. IMPRESSION: Negative. Electronically Signed   By: Fidela Salisbury M.D.   On: 07/12/2022 00:52   DG Shoulder Right  Result Date: 07/12/2022 CLINICAL DATA:  Fall, right shoulder pain EXAM: RIGHT SHOULDER - 2+ VIEW COMPARISON:  None Available. FINDINGS: There is no evidence of fracture or dislocation. There is no evidence of arthropathy or other focal bone abnormality. Soft tissues are unremarkable. IMPRESSION: Negative. Electronically  Signed   By: Fidela Salisbury M.D.   On: 07/12/2022 00:51   CT Head Wo Contrast  Result Date: 07/12/2022 CLINICAL DATA:  Poly trauma, blunt. Trip and fall injury. No loss of consciousness. No blood thinners. EXAM: CT HEAD WITHOUT CONTRAST TECHNIQUE: Contiguous axial images were obtained from the base of the skull through the vertex without intravenous contrast. RADIATION  DOSE REDUCTION: This exam was performed according to the departmental dose-optimization program which includes automated exposure control, adjustment of the mA and/or kV according to patient size and/or use of iterative reconstruction technique. COMPARISON:  09/05/2021 FINDINGS: Brain: Diffuse cerebral atrophy. Ventricular dilatation consistent with central atrophy. Low-attenuation changes in the deep white matter consistent with small vessel ischemia. No abnormal extra-axial fluid collections. No mass effect or midline shift. Gray-white matter junctions are distinct. Basal cisterns are not effaced. No acute intracranial hemorrhage. Vascular: No hyperdense vessel or unexpected calcification. Skull: Normal. Negative for fracture or focal lesion. Sinuses/Orbits: No acute finding. Other: None. IMPRESSION: No acute intracranial abnormalities. Chronic atrophy and small vessel ischemic changes. Electronically Signed   By: Lucienne Capers M.D.   On: 07/12/2022 00:31   DG Chest 2 View  Result Date: 07/11/2022 CLINICAL DATA:  Chest pain EXAM: CHEST - 2 VIEW COMPARISON:  12/29/2020 FINDINGS: Geryl Councilman overlies the lung apices. Visualized lungs are clear. No definite pneumothorax. No pleural effusion. Cardiac size within normal limits. Remote L1 compression deformity. No acute bone abnormality. IMPRESSION: 1. No active cardiopulmonary disease. Electronically Signed   By: Fidela Salisbury M.D.   On: 07/11/2022 22:09    Procedures Procedures    Medications Ordered in ED Medications - No data to display  ED Course/ Medical Decision Making/ A&P                              Medical Decision Making Patient with fall at home  Amount and/or Complexity of Data Reviewed Independent Historian:     Details: Son see above  External Data Reviewed: notes.    Details: Previous notes reviewed  Radiology: ordered and independent interpretation performed.    Details: No bleed on CT. No shoulder fracture on dislocation by me.   Risk Risk Details: Well appearing patient with mechanical fall at home.  Patient struck right side.  No fractures in the shoulder chest or pelvis.  Will start tylenol and lidoderm as this patient as this will not put the patient at risk for further falls.  Stable for discharge. Strict return.      Final Clinical Impression(s) / ED Diagnoses Final diagnoses:  None   Return for intractable cough, coughing up blood, fevers > 100.4 unrelieved by medication, shortness of breath, intractable vomiting, chest pain, shortness of breath, weakness, numbness, changes in speech, facial asymmetry, abdominal pain, passing out, Inability to tolerate liquids or food, cough, altered mental status or any concerns. No signs of systemic illness or infection. The patient is nontoxic-appearing on exam and vital signs are within normal limits.  I have reviewed the triage vital signs and the nursing notes. Pertinent labs & imaging results that were available during my care of the patient were reviewed by me and considered in my medical decision making (see chart for details). After history, exam, and medical workup I feel the patient has been appropriately medically screened and is safe for discharge home. Pertinent diagnoses were discussed with the patient. Patient was given return precautions.   Rx / DC Orders ED Discharge Orders     None         Shyhiem Beeney, MD 07/12/22 0110

## 2022-10-14 ENCOUNTER — Encounter (HOSPITAL_COMMUNITY): Payer: Self-pay

## 2022-10-14 ENCOUNTER — Emergency Department (HOSPITAL_COMMUNITY): Payer: Medicare HMO

## 2022-10-14 ENCOUNTER — Other Ambulatory Visit: Payer: Self-pay

## 2022-10-14 ENCOUNTER — Emergency Department (HOSPITAL_COMMUNITY)
Admission: EM | Admit: 2022-10-14 | Discharge: 2022-10-14 | Disposition: A | Discharge status: Home / Self Care | Payer: Medicare HMO | Attending: Emergency Medicine | Admitting: Emergency Medicine

## 2022-10-14 DIAGNOSIS — Z1152 Encounter for screening for COVID-19: Secondary | ICD-10-CM | POA: Insufficient documentation

## 2022-10-14 DIAGNOSIS — Y92019 Unspecified place in single-family (private) house as the place of occurrence of the external cause: Secondary | ICD-10-CM | POA: Diagnosis not present

## 2022-10-14 DIAGNOSIS — R930 Abnormal findings on diagnostic imaging of skull and head, not elsewhere classified: Secondary | ICD-10-CM | POA: Diagnosis not present

## 2022-10-14 DIAGNOSIS — Z79899 Other long term (current) drug therapy: Secondary | ICD-10-CM | POA: Insufficient documentation

## 2022-10-14 DIAGNOSIS — S51812A Laceration without foreign body of left forearm, initial encounter: Secondary | ICD-10-CM | POA: Diagnosis not present

## 2022-10-14 DIAGNOSIS — W010XXA Fall on same level from slipping, tripping and stumbling without subsequent striking against object, initial encounter: Secondary | ICD-10-CM | POA: Diagnosis not present

## 2022-10-14 DIAGNOSIS — S59912A Unspecified injury of left forearm, initial encounter: Secondary | ICD-10-CM | POA: Diagnosis present

## 2022-10-14 DIAGNOSIS — S51819A Laceration without foreign body of unspecified forearm, initial encounter: Secondary | ICD-10-CM

## 2022-10-14 DIAGNOSIS — W19XXXA Unspecified fall, initial encounter: Secondary | ICD-10-CM

## 2022-10-14 LAB — BASIC METABOLIC PANEL
Anion gap: 13 (ref 5–15)
BUN: 9 mg/dL (ref 8–23)
CO2: 22 mmol/L (ref 22–32)
Calcium: 9 mg/dL (ref 8.9–10.3)
Chloride: 104 mmol/L (ref 98–111)
Creatinine, Ser: 1.04 mg/dL — ABNORMAL HIGH (ref 0.44–1.00)
GFR, Estimated: 55 mL/min — ABNORMAL LOW (ref >60)
Glucose, Bld: 189 mg/dL — ABNORMAL HIGH (ref 70–99)
Potassium: 3.4 mmol/L — ABNORMAL LOW (ref 3.5–5.1)
Sodium: 139 mmol/L (ref 135–145)

## 2022-10-14 LAB — RESP PANEL BY RT-PCR (RSV, FLU A&B, COVID)  RVPGX2
Influenza A by PCR: NEGATIVE
Influenza B by PCR: NEGATIVE
Resp Syncytial Virus by PCR: NEGATIVE
SARS Coronavirus 2 by RT PCR: NEGATIVE

## 2022-10-14 LAB — CBC WITH DIFFERENTIAL/PLATELET
Abs Immature Granulocytes: 0.03 10*3/uL (ref 0.00–0.07)
Basophils Absolute: 0.1 10*3/uL (ref 0.0–0.1)
Basophils Relative: 1 %
Eosinophils Absolute: 0.3 10*3/uL (ref 0.0–0.5)
Eosinophils Relative: 2 %
HCT: 40.7 % (ref 36.0–46.0)
Hemoglobin: 14.4 g/dL (ref 12.0–15.0)
Immature Granulocytes: 0 %
Lymphocytes Relative: 38 %
Lymphs Abs: 4.5 10*3/uL — ABNORMAL HIGH (ref 0.7–4.0)
MCH: 33.6 pg (ref 26.0–34.0)
MCHC: 35.4 g/dL (ref 30.0–36.0)
MCV: 94.9 fL (ref 80.0–100.0)
Monocytes Absolute: 0.8 10*3/uL (ref 0.1–1.0)
Monocytes Relative: 7 %
Neutro Abs: 6.1 10*3/uL (ref 1.7–7.7)
Neutrophils Relative %: 52 %
Platelets: 526 10*3/uL — ABNORMAL HIGH (ref 150–400)
RBC: 4.29 MIL/uL (ref 3.87–5.11)
RDW: 11.7 % (ref 11.5–15.5)
WBC: 11.8 10*3/uL — ABNORMAL HIGH (ref 4.0–10.5)
nRBC: 0 % (ref 0.0–0.2)

## 2022-10-14 LAB — URINALYSIS, ROUTINE W REFLEX MICROSCOPIC
Bilirubin Urine: NEGATIVE
Glucose, UA: NEGATIVE mg/dL
Hgb urine dipstick: NEGATIVE
Ketones, ur: NEGATIVE mg/dL
Leukocytes,Ua: NEGATIVE
Nitrite: NEGATIVE
Protein, ur: 100 mg/dL — AB
Specific Gravity, Urine: 1.02 (ref 1.005–1.030)
pH: 5 (ref 5.0–8.0)

## 2022-10-14 NOTE — Discharge Instructions (Signed)
Avoid putting antibiotic ointment on the skin glue.  Try to keep the area dry.  Change the dressing daily.

## 2022-10-14 NOTE — ED Provider Notes (Signed)
[place] EMERGENCY DEPARTMENT AT [facility] Provider Note   CSN: [id number] Arrival date & time: 10/14/22  1643     History  Chief Complaint  Patient presents with   Kayla Booth Kayla Booth is a 82 y.o. female.   Fall     Patient presents to the ED for evaluation after a fall.  Patient was at home when she accidentally tripped and stumbled when she caught her ankle on something.  Patient mention possibly feeling weak initially but denied that to me during my evaluation.  She has not had any lightheadedness.  No loss of consciousness.  No headache.  No chest pain or shortness of breath.  Patient thinks she may have struck her forearm on the table and did sustain a skin tear laceration to her left forearm.  Home Medications Prior to Admission medications   Medication Sig Start Date End Date Taking? Authorizing Provider  acetaminophen (TYLENOL 8 HOUR) 650 MG CR tablet Take 1 tablet (650 mg total) by mouth every 8 (eight) hours as needed for pain. 09/04/19   Cathie Hoops, Amy V, PA-C  cholecalciferol (VITAMIN D) 400 units TABS tablet Take 400 Units by mouth daily.    [provider]  lidocaine (LIDODERM) 5 % Place 1 patch onto the skin daily. Remove & Discard patch within 12 hours or as directed by MD 07/12/22   Nicanor Alcon, April, MD  metoprolol tartrate (LOPRESSOR) 25 MG tablet TAKE 1 TABLET BY MOUTH TWICE A DAY 02/17/22   Jake Bathe, MD  Omega-3 Fatty Acids (OMEGA-3 PO) Take 520 mg by mouth daily.    [provider]  vitamin B-12 (CYANOCOBALAMIN) 1000 MCG tablet Take 1,000 mcg by mouth daily.    [provider]      Allergies    Aspirin    Review of Systems   Review of Systems  Physical Exam Updated Vital Signs BP (!) 144/77 (BP Location: Right Arm)   Pulse (!) 107   Temp 98.7 F (37.1 C) (Oral)   Resp 18   Ht 1.524 m (5')   Wt 56.7 kg   SpO2 96%   BMI 24.41 kg/m  Physical Exam Vitals and nursing note reviewed.  Constitutional:       Appearance: She is well-developed. She is not diaphoretic.  HENT:     Head: Normocephalic and atraumatic.     Right Ear: External ear normal.     Left Ear: External ear normal.  Eyes:     General: No scleral icterus.       Right eye: No discharge.        Left eye: No discharge.     Conjunctiva/sclera: Conjunctivae normal.  Neck:     Trachea: No tracheal deviation.  Cardiovascular:     Rate and Rhythm: Normal rate and regular rhythm.  Pulmonary:     Effort: Pulmonary effort is normal. No respiratory distress.     Breath sounds: Normal breath sounds. No stridor. No wheezing or rales.  Abdominal:     General: Bowel sounds are normal. There is no distension.     Palpations: Abdomen is soft.     Tenderness: There is no abdominal tenderness. There is no guarding or rebound.  Musculoskeletal:        General: No tenderness or deformity.     Cervical back: Neck supple.     Comments: No tenderness palpation bilateral upper extremity lower extremities, no cervical thoracic or lumbar spine tenderness  Skin:  General: Skin is warm and dry.     Findings: No rash.     Comments: Skin tear noted left forearm  Neurological:     General: No focal deficit present.     Mental Status: She is alert.     Cranial Nerves: No cranial nerve deficit, dysarthria or facial asymmetry.     Sensory: No sensory deficit.     Motor: No weakness, abnormal muscle tone or seizure activity.     Coordination: Coordination normal.  Psychiatric:        Mood and Affect: Mood normal.     ED Results / Procedures / Treatments   Labs (all labs ordered are listed, but only abnormal results are displayed) Labs Reviewed  CBC WITH DIFFERENTIAL/PLATELET - Abnormal; Notable for the following components:      Result Value   WBC 11.8 (*)    Platelets 526 (*)    Lymphs Abs 4.5 (*)    All other components within normal limits  BASIC METABOLIC PANEL - Abnormal; Notable for the following components:   Potassium 3.4 (*)     Glucose, Bld 189 (*)    Creatinine, Ser 1.04 (*)    GFR, Estimated 55 (*)    All other components within normal limits  URINALYSIS, ROUTINE W REFLEX MICROSCOPIC - Abnormal; Notable for the following components:   Color, Urine AMBER (*)    APPearance HAZY (*)    Protein, ur 100 (*)    Bacteria, UA RARE (*)    All other components within normal limits  RESP PANEL BY RT-PCR (RSV, FLU A&B, COVID)  [id number]    EKG None  Radiology CT Head Wo Contrast  Result Date: 10/14/2022 CLINICAL DATA:  Fall EXAM: CT HEAD WITHOUT CONTRAST TECHNIQUE: Contiguous axial images were obtained from the base of the skull through the vertex without intravenous contrast. RADIATION DOSE REDUCTION: This exam was performed according to the departmental dose-optimization program which includes automated exposure control, adjustment of the mA and/or kV according to patient size and/or use of iterative reconstruction technique. COMPARISON:  CT brain 07/12/2022 FINDINGS: Brain: No acute territorial infarction, hemorrhage, or intracranial mass. Mild atrophy. Nonenlarged ventricles. Minimal white matter hypodensity likely chronic small vessel ischemic change. Vascular: No hyperdense vessels. Vertebral and carotid vascular calcification Skull: Normal. Negative for fracture or focal lesion. Sinuses/Orbits: No acute finding. Other: None IMPRESSION: No CT evidence for acute intracranial abnormality. Atrophy and minimal chronic small vessel ischemic changes of the white matter. Electronically Signed   By: Jasmine Pang M.D.   On: 10/14/2022 17:54   DG Forearm Left  Result Date: 10/14/2022 CLINICAL DATA:  Fall.  Weakness. EXAM: LEFT FOREARM - 2 VIEW COMPARISON:  None Available. FINDINGS: There is diffuse decreased bone mineralization. At least moderate radiocapitellar joint space narrowing is suggested. Mild-to-moderate radiocarpal joint space narrowing. Severe thumb carpometacarpal joint space narrowing, subchondral sclerosis, and  peripheral osteophytosis. Moderate to severe triscaphe joint space narrowing and subchondral sclerosis. No acute fracture is seen. IMPRESSION: 1. No acute fracture. 2. 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 Electronically Signed   By: Neita Booth M.D.   On: 10/14/2022 17:43   DG Chest 1 View  Result Date: 10/14/2022 CLINICAL DATA:  Fall.  Weakness. EXAM: CHEST  1 VIEW COMPARISON:  Chest two views 07/11/2022, AP chest 12/29/2020 FINDINGS: Cardiac silhouette is at the upper limits of normal size. Mediastinal contours within normal limits. The patient is mandible partially obscures the lung apices. The lungs are clear. No pleural effusion or  pneumothorax. No acute skeletal abnormality is seen. IMPRESSION: No acute cardiopulmonary disease process. Electronically Signed   By: Neita Booth M.D.   On: 10/14/2022 17:39    Procedures .Marland KitchenLaceration Repair  Date/Time: 10/14/2022 9:43 PM  Performed by: Linwood Dibbles, MD Authorized by: Linwood Dibbles, MD   Consent:    Consent obtained:  Verbal   Consent given by:  Patient   Risks discussed:  Infection, pain and poor cosmetic result Anesthesia:    Anesthesia method:  None Laceration details:    Location: Left forearm.   Length (cm):  7.5 Treatment:    Area cleansed with:  Saline   Amount of cleaning:  Standard Skin repair:    Repair method:  Tissue adhesive     Medications Ordered in ED Medications - No data to display  ED Course/ Medical Decision Making/ A&P Clinical Course as of 10/14/22 2143  Fri Oct 14, 2022  2142 ED workup reassuring.  No signs of dehydration.  CBC metabolic panel unremarkable.  Urinalysis without infection.  Head CT forearm x-ray and chest x-ray without acute abnormalities [JK]    Clinical Course User Index [JK] Linwood Dibbles, MD                             Medical Decision Making Problems Addressed: Fall, initial encounter: acute illness or injury Skin tear of forearm without  complication, initial encounter: acute illness or injury  Amount and/or Complexity of Data Reviewed Labs: ordered. Decision-making details documented in ED Course. Radiology: ordered and independent interpretation performed.   Patient presented to the ED for evaluation of a fall.  Sounds like mechanical in nature.  Patient denied any syncope.  She is not anemic.  Not dehydrated.  No signs of any serious injury noted on exam.  X-rays and CT scans without serious injury.  Patient is feeling well and would like to go home.          Final Clinical Impression(s) / ED Diagnoses Final diagnoses:  Fall, initial encounter  Skin tear of forearm without complication, initial encounter    Rx / DC Orders ED Discharge Orders     None         Linwood Dibbles, MD 10/14/22 2144

## 2022-10-14 NOTE — ED Triage Notes (Signed)
Pt arrives POV after falling today. Pt states she fell because she has been "turned her ankle going down the steps". Pt has a skin tear on her left forearm. Denies hitting head/dizziness/LOC. No thinners.

## 2022-10-14 NOTE — ED Provider Triage Note (Signed)
Emergency Medicine Provider Triage Evaluation Note  Kayla Booth , a 82 y.o. female  was evaluated in triage.  Pt complains of fall that occurred just prior to arrival.  She states her ankle twisted as she was stepping down 1 step she has exiting the house.  She thinks this might of been precipitated due to recent generalized weakness.  She is unsure exactly how long she has had this generalized weakness.  Denies lightheadedness, dizziness, loss of consciousness, head injury.  She is unsure exactly what she struck her left forearm on when she fell.  She believes there was a table nearby.  Review of Systems  Positive: As above Negative: As above  Physical Exam  BP (!) 144/77 (BP Location: Right Arm)   Pulse (!) 107   Temp 98.7 F (37.1 C) (Oral)   Resp 18   Ht 5' (1.524 m)   Wt 56.7 kg   SpO2 96%   BMI 24.41 kg/m  Gen:   Awake, no distress   Resp:  Normal effort  MSK:   Moves extremities without difficulty  Other:  More superficial laceration noted to left forearm.  Medical Decision Making  Medically screening exam initiated at 4:53 PM.  Appropriate orders placed.  Kayla Booth was informed that the remainder of the evaluation will be completed by another provider, this initial triage assessment does not replace that evaluation, and the importance of remaining in the ED until their evaluation is complete.  Given fall and generalized weakness.  CT head, left forearm x-ray, chest x-ray, UA ordered.  Basic labs ordered.   Marita [place], PA-C 10/14/22 1654

## 2022-10-24 NOTE — Progress Notes (Unsigned)
Cardiology Office Note:    Date:  10/24/2022  ID:  Kayla Booth, DOB 1940-11-08, MRN [id number] PCP: Pcp, No  [facility] Providers Cardiologist:  Donato Schultz, MD { Click to update primary MD,subspecialty MD or APP then REFRESH:1}    {Click to Open Review  :1}   Patient Profile:    Persistent atrial fibrillation  S/p failed DCCV in the past  Anticoag DCd in May 2023 due to freq falls TTE 12/17/2017: EF 50-55, no RWMA, mild AI, trivial MR, normal RVSF, trivial TR, PASP 22  Coronary artery calcification Aortic atherosclerosis  Hypertension  Hyperlipidemia  Frequent falls Hereditary spherocytosis       History of Present Illness:   Kayla Booth is a 82 y.o. female who returns for follow-up of atrial fibrillation.  She was last seen by Dr. Anne Fu 04/25/2022.  Since that time, she has been to the emergency room twice with a fall (February and May).***  ROS ***    Studies Reviewed:    EKG:  ***  *** Risk Assessment/Calculations:   {Does this patient have ATRIAL FIBRILLATION?:[id number]} No BP recorded.  {Refresh Note OR Click here to enter BP  :1}***       Physical Exam:   VS:  There were no vitals taken for this visit.   Wt Readings from Last 3 Encounters:  10/14/22 125 lb (56.7 kg)  07/11/22 125 lb (56.7 kg)  04/25/22 131 lb 3.2 oz (59.5 kg)    Physical Exam***    ASSESSMENT AND PLAN:   No problem-specific Assessment & Plan notes found for this encounter. ***{  Longstanding persistent atrial fibrillation (HCC) Rate is well controlled.  CHA2DS2-VASc Score = 5 [CHF History: 0, HTN History: 1, Diabetes History: 0, Stroke History: 0, Vascular Disease History: 1, Age Score: 2, Gender Score: 1].  Therefore, the patient's annual risk of stroke is 7.2 %. HAS-BLED score is low but, she has had frequent falls in the last 6 mos.  She has been to the ED several times.  She has hit her head and required a head CT on 2 occasions.  She had no bleed on either study.  She notes  that she has fallen several other times at home and did not have to seek medical attention.  Her primary cardiologist, Dr. Anne Fu has recommended that she stop anticoagulation given her high risk of bleeding in the setting of frequent falls.  I did touch base with Dr. Anne Fu today via SecureChat.  He does not feel that she is a candidate for LAA closure device.  I explained all of this to the patient and she knows that she is to remain off of Eliquis going forward.  F/u with Dr. Anne Fu in 6 mos.    Aortic atherosclerosis (HCC) We discussed possibly starting ASA 81 mg at some point once we can confirm she is no longer having any rectal bleeding etc.  However, she is allergic to ASA. Continue current Rx.    HTN (hypertension) BP is controlled.  Continue metoprolol tartrate 25 mg twice daily.     Frequent falls She uses a walker most of the time.  As noted, she has had multiple falls.  Luckily, she has not had any intracranial bleeds or broken bones.      :1}     {Are you ordering a CV Procedure (e.g. stress test, cath, DCCV, TEE, etc)?   Press F2        :[id number]}  Dispo:  No follow-ups on  file.  Signed, Tereso Newcomer, PA-C

## 2022-10-25 ENCOUNTER — Ambulatory Visit: Payer: Medicare HMO | Attending: Physician Assistant | Admitting: Cardiology

## 2022-10-25 ENCOUNTER — Encounter: Payer: Self-pay | Admitting: Physician Assistant

## 2022-10-25 VITALS — BP 122/70 | HR 88 | Ht 60.0 in | Wt 129.0 lb

## 2022-10-25 DIAGNOSIS — I10 Essential (primary) hypertension: Secondary | ICD-10-CM

## 2022-10-25 DIAGNOSIS — I4811 Longstanding persistent atrial fibrillation: Secondary | ICD-10-CM

## 2022-10-25 DIAGNOSIS — R296 Repeated falls: Secondary | ICD-10-CM | POA: Diagnosis not present

## 2022-10-25 DIAGNOSIS — I700 Atherosclerosis of aorta: Secondary | ICD-10-CM | POA: Diagnosis not present

## 2022-10-25 NOTE — Patient Instructions (Signed)
Medication Instructions:  Your physician recommends that you continue on your current medications as directed. Please refer to the Current Medication list given to you today.  *If you need a refill on your cardiac medications before your next appointment, please call your pharmacy*   Lab Work: None ordered  If you have labs (blood work) drawn today and your tests are completely normal, you will receive your results only by: MyChart Message (if you have MyChart) OR A paper copy in the mail If you have any lab test that is abnormal or we need to change your treatment, we will call you to review the results.   Testing/Procedures: None ordered   Follow-Up: At [facility], you and your health needs are our priority.  As part of our continuing mission to provide you with exceptional heart care, we have created designated Provider Care Teams.  These Care Teams include your primary Cardiologist (physician) and Advanced Practice Providers (APPs -  Physician Assistants and Nurse Practitioners) who all work together to provide you with the care you need, when you need it.  We recommend signing up for the patient portal called "MyChart".  Sign up information is provided on this After Visit Summary.  MyChart is used to connect with patients for Virtual Visits (Telemedicine).  Patients are able to view lab/test results, encounter notes, upcoming appointments, etc.  Non-urgent messages can be sent to your provider as well.   To learn more about what you can do with MyChart, go to [email or web address].    Your next appointment:   6 month(s)  Provider:   Mark Skains, MD     Other Instructions  

## 2022-10-25 NOTE — Progress Notes (Signed)
Cardiology Office Note:    Date:  10/25/2022   ID:  Kayla Booth, Kayla Booth [birth year], MRN [id number]  PCP:  Aviva Kluver   [facility] Providers Cardiologist:  Donato Schultz, MD     Referring MD: No ref. provider found   Chief Complaint: Follow up for atrial fibrillation   History of Present Illness:    Kayla Booth is a 82 y.o. female with a hx of HTN, HLD, abnormal LFTs, coronary and aortic calcification and longstanding persistent atrial fibrillation here today for follow up of hypertension and atrial fibrillation.  She was seen Oct 14, 2022 and July 11, 2022 in the ED following mechanical falls. Her anticoagulant was stopped in May 2023 for her frequent falls. No head trauma was reported, her Xrays and CT scans indicated no serious injury. Her fall in February did result in a left arm laceration.   Today she denies any palpitations, chest pain, shortness of breath. Denies lightheadedness, headaches, syncope. Reports her legs are a little swollen but feels this is her baseline, she sleeps on two pillows, also states this is her normal.   Notes she still needs to establish care with a primary care provider. She presents with her son and walks with a walker.   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HCC)    HTN (hypertension)    Hyperlipidemia    Persistent atrial fibrillation (HCC) 12/16/2017   Eliquis DCd in May 2023 due to frequent falls    Past Surgical History:  Procedure Laterality Date   CARDIOVERSION N/A 01/19/2018   Procedure: CARDIOVERSION;  Surgeon: Wendall Stade, MD;  Location: [facility] ENDOSCOPY;  Service: Cardiovascular;  Laterality: N/A;   SPLENECTOMY      Current Medications: Current Meds  Medication Sig   acetaminophen (TYLENOL 8 HOUR) 650 MG CR tablet Take 1 tablet (650 mg total) by mouth every 8 (eight) hours as  needed for pain.   cholecalciferol (VITAMIN D) 400 units TABS tablet Take 400 Units by mouth daily.   metoprolol tartrate (LOPRESSOR) 25 MG tablet TAKE 1 TABLET BY MOUTH TWICE A DAY   Omega-3 Fatty Acids (OMEGA-3 PO) Take 520 mg by mouth daily.   vitamin B-12 (CYANOCOBALAMIN) 1000 MCG tablet Take 1,000 mcg by mouth daily.     Allergies:   Aspirin   Social History   Socioeconomic History   Marital status: Divorced    Spouse name: Not on file   Number of children: Not on file   Years of education: Not on file   Highest education level: Not on file  Occupational History   Not on file  Tobacco Use   Smoking status: Never   Smokeless tobacco: Never  Vaping Use   Vaping Use: Never used  Substance and Sexual Activity   Alcohol use: No   Drug use: No   Sexual activity: Not on file  Other Topics Concern   Not on file  Social History Narrative   Not on file   Social Determinants of Health   Financial Resource Strain: Not on file  Food Insecurity: Not on file  Transportation Needs: Not on file  Physical Activity: Not on file  Stress: Not on file  Social Connections: Not on file     Family History: The patient's family history includes Diabetes in her mother; Heart failure in an other family member; Stroke in her  mother.  ROS:   Please see the history of present illness.    All other systems reviewed and are negative.  EKGs/Labs/Other Studies Reviewed:    The following studies were reviewed today:  CT Head without contrast (10/14/22)  FINDINGS: Brain: No acute territorial infarction, hemorrhage, or intracranial mass. Mild atrophy. Nonenlarged ventricles. Minimal white matter hypodensity likely chronic small vessel ischemic change. Vascular: No hyperdense vessels. Vertebral and carotid vascular calcification Skull: Normal. Negative for fracture or focal lesion. Sinuses/Orbits: No acute finding. Other: None IMPRESSION: No CT evidence for acute intracranial  abnormality. Atrophy and minimal chronic small vessel ischemic changes of the white matter.  EKG:   07/12/22: Afib with RVR at 102bpm 09/05/21: Afib at 91bpm   Recent Labs: 10/14/2022: BUN 9; Creatinine, Ser 1.04; Hemoglobin 14.4; Platelets 526; Potassium 3.4; Sodium 139  Recent Lipid Panel 04/01/21    Component Value Date/Time   CHOL 210 (H) 04/01/2021 0942   TRIG 113 04/01/2021 0942   HDL 62 04/01/2021 0942   CHOLHDL 3.4 04/01/2021 0942   LDLCALC 128 (H) 04/01/2021 0942     Risk Assessment/Calculations:    CHA2DS2-VASc Score =   5 [CHF history: 0, HTN history:1, Diabetes history: 0, stroke history: 0, Vascular disease history: 1, Age Score: 2, Gender Score:1] Patients annual risk of stroke is 7.2%.             Physical Exam:    VS:  BP 122/70   Pulse 88   Ht 5' (1.524 m)   Wt 129 lb (58.5 kg)   SpO2 97%   BMI 25.19 kg/m     Wt Readings from Last 3 Encounters:  10/25/22 129 lb (58.5 kg)  10/14/22 125 lb (56.7 kg)  07/11/22 125 lb (56.7 kg)    GEN: Well nourished, well developed in no acute distress NECK: No JVD; No carotid bruits CARDIAC: Irregular rate and rhythm, no murmurs, rubs, gallops RESPIRATORY:  Clear to auscultation without rales, wheezing or rhonchi  ABDOMEN: Soft, non-tender, non-distended MUSCULOSKELETAL:  +1 lower extremity edema; No deformity  SKIN: Warm and dry NEUROLOGIC:  Alert and oriented x 3 PSYCHIATRIC:  Normal affect   ASSESSMENT:    1. Longstanding persistent atrial fibrillation (HCC)   2. Frequent falls   3. Aortic atherosclerosis (HCC)   4. Primary hypertension    PLAN:    In order of problems listed above:  1. Longstanding persistent atrial fibrillation (HCC) Rate is well controlled, heart rate today was 88bpm, she denies having palpitations or shortness of breath. Has failed multiple cardioversion in the past.  CHA2DS2-VASc Score =   5 [CHF history: 0, HTN history:1, Diabetes history: 0, stroke history: 0, Vascular disease  history: 1, Age Score: 2, Gender Score:1] Patients annual risk of stroke is 7.2%. HAS-BLED score is low but she has history of frequent falls with several ED visits, most recent fall May 17th. She has been off anticoagulation since May 2023 and will remain off as a result of her increased falls. Dr. Anne Fu has previously noted she is not a candidate for LAA closure device. Continue on metoprolol tartrate 25mg  daily for rate control.   2. Frequent falls Last fall on 10/14/22, she presented to ED. X-ray and CT indicated no serious injury. Noted to have likely been mechanical in nature, patient caught her foot/ankle on something and hit her arm. Also noted to have fallen on 07/11/22, mechanical in nature. With her history of frequent falls will remain off anticoagulation.   3. Aortic atherosclerosis (  HCC) She has had previous discussions about starting on ASA 81mg , however she is allergic. Continue on metoprolol tartrate 25mg  daily for blood pressure control.   4. Primary hypertension Blood pressure today 122/70. Reports she does not monitor her blood pressure at home. Continue on metoprolol tartrate 25mg  daily for blood pressure control.   Medication Adjustments/Labs and Tests Ordered: Current medicines are reviewed at length with the patient today.  Concerns regarding medicines are outlined above.  No orders of the defined types were placed in this encounter.  No orders of the defined types were placed in this encounter.   Patient Instructions  Medication Instructions:  Your physician recommends that you continue on your current medications as directed. Please refer to the Current Medication list given to you today.  *If you need a refill on your cardiac medications before your next appointment, please call your pharmacy*   Lab Work: None ordered  If you have labs (blood work) drawn today and your tests are completely normal, you will receive your results only by: MyChart Message (if you  have MyChart) OR A paper copy in the mail If you have any lab test that is abnormal or we need to change your treatment, we will call you to review the results.   Testing/Procedures: None ordered   Follow-Up: At [facility], you and your health needs are our priority.  As part of our continuing mission to provide you with exceptional heart care, we have created designated Provider Care Teams.  These Care Teams include your primary Cardiologist (physician) and Advanced Practice Providers (APPs -  Physician Assistants and Nurse Practitioners) who all work together to provide you with the care you need, when you need it.  We recommend signing up for the patient portal called "MyChart".  Sign up information is provided on this After Visit Summary.  MyChart is used to connect with patients for Virtual Visits (Telemedicine).  Patients are able to view lab/test results, encounter notes, upcoming appointments, etc.  Non-urgent messages can be sent to your provider as well.   To learn more about what you can do with MyChart, go to [email or web address].    Your next appointment:   6 month(s)  Provider:   Donato Schultz, MD  .   Other Instructions    Signed, Rip Harbour, NP  10/25/2022 4:34 PM    [facility]

## 2022-11-15 ENCOUNTER — Other Ambulatory Visit: Payer: Self-pay | Admitting: Cardiology

## 2023-04-02 ENCOUNTER — Encounter (HOSPITAL_COMMUNITY): Payer: Self-pay

## 2023-04-02 ENCOUNTER — Emergency Department (HOSPITAL_COMMUNITY): Payer: Medicare HMO

## 2023-04-02 ENCOUNTER — Emergency Department (HOSPITAL_COMMUNITY)
Admission: EM | Admit: 2023-04-02 | Discharge: 2023-04-02 | Disposition: A | Discharge status: Home / Self Care | Payer: Medicare HMO | Attending: Emergency Medicine | Admitting: Emergency Medicine

## 2023-04-02 ENCOUNTER — Other Ambulatory Visit: Payer: Self-pay

## 2023-04-02 DIAGNOSIS — I10 Essential (primary) hypertension: Secondary | ICD-10-CM | POA: Insufficient documentation

## 2023-04-02 DIAGNOSIS — S5002XA Contusion of left elbow, initial encounter: Secondary | ICD-10-CM | POA: Diagnosis not present

## 2023-04-02 DIAGNOSIS — R519 Headache, unspecified: Secondary | ICD-10-CM | POA: Insufficient documentation

## 2023-04-02 DIAGNOSIS — I2510 Atherosclerotic heart disease of native coronary artery without angina pectoris: Secondary | ICD-10-CM | POA: Diagnosis not present

## 2023-04-02 DIAGNOSIS — Z79899 Other long term (current) drug therapy: Secondary | ICD-10-CM | POA: Insufficient documentation

## 2023-04-02 DIAGNOSIS — W19XXXA Unspecified fall, initial encounter: Secondary | ICD-10-CM

## 2023-04-02 DIAGNOSIS — W010XXA Fall on same level from slipping, tripping and stumbling without subsequent striking against object, initial encounter: Secondary | ICD-10-CM | POA: Diagnosis not present

## 2023-04-02 DIAGNOSIS — S6992XA Unspecified injury of left wrist, hand and finger(s), initial encounter: Secondary | ICD-10-CM | POA: Diagnosis present

## 2023-04-02 DIAGNOSIS — S62525A Nondisplaced fracture of distal phalanx of left thumb, initial encounter for closed fracture: Secondary | ICD-10-CM

## 2023-04-02 DIAGNOSIS — T148XXA Other injury of unspecified body region, initial encounter: Secondary | ICD-10-CM

## 2023-04-02 NOTE — ED Notes (Signed)
Patient transported to X-ray 

## 2023-04-02 NOTE — Discharge Instructions (Signed)
Your history, exam, workup today revealed a thumb fracture as the only significant traumatic injury.  Please use the thumb immobilizing splint and follow-up with orthopedics or your primary doctor.  Please rest and stay hydrated.  If any symptoms change or worsen acutely, please return to the nearest emergency department.

## 2023-04-02 NOTE — ED Provider Notes (Signed)
[place] [facility] AT [facility] Provider Note   CSN: [id number] Arrival date & time: 04/02/23  6578     History  Chief Complaint  Patient presents with   Alva Garnet LEATHIE WEICH is a 82 y.o. female.  The history is provided by the patient and medical records. No language interpreter was used.  Fall This is a new problem. The current episode started less than 1 hour ago. The problem occurs rarely. The problem has not changed since onset.Associated symptoms include headaches (very mild). Pertinent negatives include no chest pain, no abdominal pain and no shortness of breath. Nothing aggravates the symptoms. Nothing relieves the symptoms. She has tried nothing for the symptoms. The treatment provided no relief.       Home Medications Prior to Admission medications   Medication Sig Start Date End Date Taking? Authorizing Provider  acetaminophen (TYLENOL 8 HOUR) 650 MG CR tablet Take 1 tablet (650 mg total) by mouth every 8 (eight) hours as needed for pain. 09/04/19   Cathie Hoops, Amy V, PA-C  cholecalciferol (VITAMIN D) 400 units TABS tablet Take 400 Units by mouth daily.    [provider]  metoprolol tartrate (LOPRESSOR) 25 MG tablet TAKE 1 TABLET BY MOUTH TWICE A DAY 11/15/22   Jake Bathe, MD  Omega-3 Fatty Acids (OMEGA-3 PO) Take 520 mg by mouth daily.    [provider]  vitamin B-12 (CYANOCOBALAMIN) 1000 MCG tablet Take 1,000 mcg by mouth daily.    [provider]      Allergies    Aspirin    Review of Systems   Review of Systems  Constitutional:  Negative for chills, fatigue and fever.  HENT:  Negative for congestion.   Eyes:  Negative for visual disturbance.  Respiratory:  Negative for cough, chest tightness, shortness of breath and wheezing.   Cardiovascular:  Negative for chest pain, palpitations and leg swelling.  Gastrointestinal:  Negative for abdominal pain, constipation, diarrhea, nausea and vomiting.  Genitourinary:   Negative for dysuria and flank pain.  Musculoskeletal:  Positive for back pain and neck pain.  Skin:  Positive for wound (abrasions). Negative for rash.  Neurological:  Positive for headaches (very mild). Negative for weakness, light-headedness and numbness.  Psychiatric/Behavioral:  Negative for agitation and confusion.   All other systems reviewed and are negative.   Physical Exam Updated Vital Signs BP (!) 145/89   Pulse (!) 116   Temp 98 F (36.7 C) (Oral)   Resp 17   Ht 5' (1.524 m)   Wt 54.4 kg   SpO2 93%   BMI 23.44 kg/m  Physical Exam Vitals and nursing note reviewed.  Constitutional:      General: She is not in acute distress.    Appearance: She is well-developed. She is not ill-appearing, toxic-appearing or diaphoretic.  HENT:     Head: Normocephalic and atraumatic.     Nose: No congestion.     Mouth/Throat:     Pharynx: No oropharyngeal exudate or posterior oropharyngeal erythema.  Eyes:     Extraocular Movements: Extraocular movements intact.     Conjunctiva/sclera: Conjunctivae normal.  Cardiovascular:     Rate and Rhythm: Normal rate and regular rhythm.     Pulses: Normal pulses.     Heart sounds: No murmur heard. Pulmonary:     Effort: Pulmonary effort is normal. No respiratory distress.     Breath sounds: Normal breath sounds. No wheezing, rhonchi or rales.  Chest:  Chest wall: No tenderness.  Abdominal:     General: Abdomen is flat.     Palpations: Abdomen is soft.     Tenderness: There is no abdominal tenderness. There is no right CVA tenderness, left CVA tenderness, guarding or rebound.  Musculoskeletal:        General: Tenderness and signs of injury present. No swelling.     Cervical back: Neck supple. No tenderness.     Right lower leg: No edema.     Left lower leg: No edema.  Skin:    General: Skin is warm and dry.     Capillary Refill: Capillary refill takes less than 2 seconds.     Findings: Bruising present. No erythema or rash.   Neurological:     General: No focal deficit present.     Mental Status: She is alert.  Psychiatric:        Mood and Affect: Mood normal.     ED Results / Procedures / Treatments   Labs (all labs ordered are listed, but only abnormal results are displayed) Labs Reviewed - No data to display  EKG None  Radiology DG Finger Thumb Left  Result Date: 04/02/2023 CLINICAL DATA:  Fall today with multiple abrasions and bruising in the left upper extremity. EXAM: LEFT WRIST - COMPLETE 3+ VIEW; LEFT THUMB 2+V; LEFT ELBOW - COMPLETE 3+ VIEW COMPARISON:  Radiographs of the left humerus 08/19/2021 and left forearm 10/14/2022. FINDINGS: Left elbow: The bones are demineralized. Moderate radiocapitellar joint space narrowing. No evidence of acute fracture, dislocation or significant joint effusion. Skin laxity without apparent focal swelling or foreign body. Left wrist: The bones are demineralized. No evidence of acute fracture or dislocation within the wrist. Advanced degenerative changes are noted at the 1st carpometacarpal and scaphotrapeziotrapezoidal joints. No evidence of foreign body. Left thumb: Acute appearing, nondisplaced intra-articular fracture involving the ulnar base of the distal 1st phalanx. No other acute osseous findings are demonstrated. There are mild degenerative changes at the metacarpal phalangeal and interphalangeal joints. No evidence of foreign body or soft tissue emphysema. IMPRESSION: 1. Acute appearing, nondisplaced intra-articular fracture involving the ulnar base of the distal phalanx of the left thumb. 2. No other acute osseous findings identified in the left elbow, wrist or thumb. 3. Degenerative changes as described. Electronically Signed   By: Carey Bullocks M.D.   On: 04/02/2023 08:54   DG Wrist Complete Left  Result Date: 04/02/2023 CLINICAL DATA:  Fall today with multiple abrasions and bruising in the left upper extremity. EXAM: LEFT WRIST - COMPLETE 3+ VIEW; LEFT THUMB  2+V; LEFT ELBOW - COMPLETE 3+ VIEW COMPARISON:  Radiographs of the left humerus 08/19/2021 and left forearm 10/14/2022. FINDINGS: Left elbow: The bones are demineralized. Moderate radiocapitellar joint space narrowing. No evidence of acute fracture, dislocation or significant joint effusion. Skin laxity without apparent focal swelling or foreign body. Left wrist: The bones are demineralized. No evidence of acute fracture or dislocation within the wrist. Advanced degenerative changes are noted at the 1st carpometacarpal and scaphotrapeziotrapezoidal joints. No evidence of foreign body. Left thumb: Acute appearing, nondisplaced intra-articular fracture involving the ulnar base of the distal 1st phalanx. No other acute osseous findings are demonstrated. There are mild degenerative changes at the metacarpal phalangeal and interphalangeal joints. No evidence of foreign body or soft tissue emphysema. IMPRESSION: 1. Acute appearing, nondisplaced intra-articular fracture involving the ulnar base of the distal phalanx of the left thumb. 2. No other acute osseous findings identified in the left elbow, wrist  or thumb. 3. Degenerative changes as described. Electronically Signed   By: Carey Bullocks M.D.   On: 04/02/2023 08:54   DG Elbow Complete Left  Result Date: 04/02/2023 CLINICAL DATA:  Fall today with multiple abrasions and bruising in the left upper extremity. EXAM: LEFT WRIST - COMPLETE 3+ VIEW; LEFT THUMB 2+V; LEFT ELBOW - COMPLETE 3+ VIEW COMPARISON:  Radiographs of the left humerus 08/19/2021 and left forearm 10/14/2022. FINDINGS: Left elbow: The bones are demineralized. Moderate radiocapitellar joint space narrowing. No evidence of acute fracture, dislocation or significant joint effusion. Skin laxity without apparent focal swelling or foreign body. Left wrist: The bones are demineralized. No evidence of acute fracture or dislocation within the wrist. Advanced degenerative changes are noted at the 1st  carpometacarpal and scaphotrapeziotrapezoidal joints. No evidence of foreign body. Left thumb: Acute appearing, nondisplaced intra-articular fracture involving the ulnar base of the distal 1st phalanx. No other acute osseous findings are demonstrated. There are mild degenerative changes at the metacarpal phalangeal and interphalangeal joints. No evidence of foreign body or soft tissue emphysema. IMPRESSION: 1. Acute appearing, nondisplaced intra-articular fracture involving the ulnar base of the distal phalanx of the left thumb. 2. No other acute osseous findings identified in the left elbow, wrist or thumb. 3. Degenerative changes as described. Electronically Signed   By: Carey Bullocks M.D.   On: 04/02/2023 08:54   DG Lumbar Spine Complete  Result Date: 04/02/2023 CLINICAL DATA:  Fall.  Pain. EXAM: LUMBAR SPINE - COMPLETE 4+ VIEW COMPARISON:  CT scan 08/08/2020 FINDINGS: Bones are markedly demineralized. L1 compression deformity stable since 2022. No new compression deformity in the lumbar spine. Mild loss of disc height noted diffusely in the lumbar spine. SI joints unremarkable. IMPRESSION: Marked osteopenia with chronic L1 compression deformity. No new compression deformity in the lumbar spine. Electronically Signed   By: Kennith Center M.D.   On: 04/02/2023 08:51   CT HEAD WO CONTRAST ( )  Result Date: 04/02/2023 CLINICAL DATA:  Minor head trauma EXAM: CT HEAD WITHOUT CONTRAST TECHNIQUE: Contiguous axial images were obtained from the base of the skull through the vertex without intravenous contrast. RADIATION DOSE REDUCTION: This exam was performed according to the departmental dose-optimization program which includes automated exposure control, adjustment of the mA and/or kV according to patient size and/or use of iterative reconstruction technique. COMPARISON:  10/14/2022 FINDINGS: Brain: No evidence of acute infarction, hemorrhage, hydrocephalus, extra-axial collection or mass lesion/mass effect.  Vascular: No hyperdense vessel or unexpected calcification. Skull: Normal. Negative for fracture or focal lesion. Sinuses/Orbits: No acute finding. IMPRESSION: Negative head CT. Electronically Signed   By: Tiburcio Pea M.D.   On: 04/02/2023 08:47    Procedures Procedures    Medications Ordered in ED Medications - No data to display    ED Course/ Medical Decision Making/ A&P                                 Medical Decision Making Amount and/or Complexity of Data Reviewed Radiology: ordered.    WANZA SZUMSKI is a 82 y.o. female with a past medical history significant for A-fib not on blood thinners due to recurrent falls, CAD, hypertension, and hyperlipidemia who presents for a fall.  Patient reports that she had mechanical fall while turning around in her house this morning she got tripped up and fell to the ground.  She did not lose consciousness but hit the back of her head,  hit her left thumb, left wrist, and left elbow.  She also reporting some mild pain in her low back although she reports she always has back pain that is slightly worse.  Denies any preceding symptoms such as fevers, chills, congestion, cough, nausea, vomiting, constipation, diarrhea, or urinary changes.  She reports she thinks her tetanus shot is up-to-date.  Denies any neck pain or neck stiffness and denies any pain in her torso and her chest abdomen pelvis.    On exam, patient has a small skin tear to her left lateral wrist, left elbow, and bruising on her left thumb.  No deep laceration seen requiring sutures at this time.  Will have them cleaned and bandaged by nursing.  Patient had mild paraspinal tenderness in her lumbar spine so we will get x-ray and she did not have any laceration or crepitance on her scalp but will get CT head due to the mild headache after hitting her head.  Otherwise denies complaints we will hold on more extensive workup.  Patient found agree with plan and will get the CT scan of the  head and the x-rays and will reassess.  Anticipate discharge if workup reassuring.  9:52 AM X-rays returned showing evidence of small thumb fracture but otherwise no acute traumatic injury seen.  Will have her wounds cleaned and dressed and we discussed a crafted splint versus removable splint and we agreed with doing a removal splint for her for a thumb spica immobilization.  She will follow-up with orthopedic hand surgery as an outpatient.  Anticipate discharge after wound management and thumb management.  Thumb was immobilized with removable thumb spica and patient tolerated.  Patient agrees with plan for discharge and follow-up and was discharged in good condition.        Final Clinical Impression(s) / ED Diagnoses Final diagnoses:  Closed nondisplaced fracture of distal phalanx of left thumb, initial encounter  Fall, initial encounter  Multiple skin tears    Rx / DC Orders ED Discharge Orders     None       Clinical Impression: 1. Closed nondisplaced fracture of distal phalanx of left thumb, initial encounter   2. Fall, initial encounter   3. Multiple skin tears     Disposition: Discharge  Condition: Good  I have discussed the results, Dx and Tx plan with the pt(& family if present). He/she/they expressed understanding and agree(s) with the plan. Discharge instructions discussed at great length. Strict return precautions discussed and pt &/or family have verbalized understanding of the instructions. No further questions at time of discharge.    Discharge Medication List as of 04/02/2023  9:55 AM      Follow Up: Domingo Mend [address] [place] [address] [phone]         Derwin Reddy, Canary Brim, MD 04/02/23 1101

## 2023-04-02 NOTE — ED Triage Notes (Signed)
Pt to ED from home following a mechanical fall. Abrasions noted  to L wrist and elbow, and to the right side of her head. No LOC no thinners. VSS, NADN.

## 2023-04-18 ENCOUNTER — Ambulatory Visit: Payer: Medicare HMO | Admitting: Cardiology

## 2023-06-19 ENCOUNTER — Encounter: Payer: Self-pay | Admitting: Emergency Medicine

## 2023-06-19 ENCOUNTER — Ambulatory Visit
Admission: EM | Admit: 2023-06-19 | Discharge: 2023-06-19 | Disposition: A | Discharge status: Home / Self Care | Payer: Medicare HMO | Attending: Internal Medicine | Admitting: Internal Medicine

## 2023-06-19 ENCOUNTER — Ambulatory Visit (INDEPENDENT_AMBULATORY_CARE_PROVIDER_SITE_OTHER): Payer: Medicare HMO

## 2023-06-19 DIAGNOSIS — M79651 Pain in right thigh: Secondary | ICD-10-CM

## 2023-06-19 DIAGNOSIS — M25561 Pain in right knee: Secondary | ICD-10-CM

## 2023-06-19 DIAGNOSIS — D580 Hereditary spherocytosis: Secondary | ICD-10-CM | POA: Insufficient documentation

## 2023-06-19 MED ORDER — DICLOFENAC SODIUM 1 % EX GEL: 2.0000 g | Freq: Four times a day (QID) | CUTANEOUS | Status: DC

## 2023-06-19 MED ORDER — DICLOFENAC SODIUM 1 % EX GEL: 2.0000 g | Freq: Four times a day (QID) | CUTANEOUS | 0 refills | Status: AC

## 2023-06-19 NOTE — Discharge Instructions (Addendum)
I will call you if the xray shows anything different than what I saw today

## 2023-06-19 NOTE — ED Provider Notes (Signed)
[name]    CSN: [id number] Arrival date & time: 06/19/23  1104      History   Chief Complaint Chief Complaint  Patient presents with   Leg Pain    HPI BRIANNI Booth is a 83 y.o. female who presents with her son whom she lives with due to having anterior distal thigh pain x 1 week Does not recall an injury, but she tends to ran into things a lot. She tried Tylenol last week, and this am Aspercreme.     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HCC)    HTN (hypertension)    Hyperlipidemia    Persistent atrial fibrillation (HCC) 12/16/2017   Eliquis DCd in May 2023 due to frequent falls    Patient Active Problem List   Diagnosis Date Noted   Hereditary spherocytosis (HCC) 06/19/2023   Chronic anticoagulation 04/01/2021   Coronary artery calcification 04/01/2021   Aortic atherosclerosis (HCC) 04/01/2021   Frequent falls 04/01/2021   Lung nodule, multiple 12/26/2017   HTN (hypertension) 12/16/2017   Longstanding persistent atrial fibrillation (HCC) 12/16/2017   Abnormal LFTs 12/16/2017   Hyperlipidemia 05/18/2017   Acquired asplenia 06/06/2014   Osteoporosis 06/13/2013    Past Surgical History:  Procedure Laterality Date   CARDIOVERSION N/A 01/19/2018   Procedure: CARDIOVERSION;  Surgeon: Wendall Stade, MD;  Location: [facility] ENDOSCOPY;  Service: Cardiovascular;  Laterality: N/A;   SPLENECTOMY      OB History   No obstetric history on file.      Home Medications    Prior to Admission medications   Medication Sig Start Date End Date Taking? Authorizing Provider  acetaminophen (TYLENOL 8 HOUR) 650 MG CR tablet Take 1 tablet (650 mg total) by mouth every 8 (eight) hours as needed for pain. 09/04/19  Yes Yu, Amy V, PA-C  cholecalciferol (VITAMIN D) 400 units TABS tablet Take 400 Units by mouth daily.   Yes [provider]  metoprolol tartrate (LOPRESSOR) 25 MG tablet TAKE 1 TABLET BY MOUTH TWICE A DAY 11/15/22  Yes Jake Bathe, MD  Omega-3 Fatty Acids (OMEGA-3 PO) Take 520 mg by mouth daily.   Yes [provider]  vitamin B-12 (CYANOCOBALAMIN) 1000 MCG tablet Take 1,000 mcg by mouth daily.   Yes [provider]  diclofenac Sodium (VOLTAREN) 1 % GEL Apply 2 g topically 4 (four) times daily. 06/19/23   Rodriguez-Southworth, Nettie Elm, PA-C    Family History Family History  Problem Relation Age of Onset   Heart failure Other    Diabetes Mother    Stroke Mother     Social History Social History   Tobacco Use   Smoking status: Never   Smokeless tobacco: Never  Vaping Use   Vaping status: Never Used  Substance Use Topics   Alcohol use: No   Drug use: No     Allergies   Aspirin   Review of Systems Review of Systems  As noted in HPI Physical Exam Triage Vital Signs ED Triage Vitals  Encounter Vitals Group     BP 06/19/23 1217 (!) 152/88     Systolic BP Percentile --      Diastolic BP Percentile --      Pulse Rate 06/19/23 1217 97     Resp 06/19/23 1217 18     Temp 06/19/23 1217 98.5 F (36.9 C)  Temp Source 06/19/23 1217 Oral     SpO2 06/19/23 1217 96 %     Weight --      Height --      Head Circumference --      Peak Flow --      Pain Score 06/19/23 1219 3     Pain Loc --      Pain Education --      Exclude from Growth Chart --    No data found.  Updated Vital Signs BP (!) 152/88 (BP Location: Left Arm)   Pulse 97   Temp 98.5 F (36.9 C) (Oral)   Resp 18   SpO2 96%   Visual Acuity Right Eye Distance:   Left Eye Distance:   Bilateral Distance:    Right Eye Near:   Left Eye Near:    Bilateral Near:     Physical Exam Constitutional:      General: She is not in acute distress.    Appearance: She is not toxic-appearing.  HENT:     Right Ear: External ear normal.     Left Ear: External ear normal.     Nose: Nose normal.  Eyes:      General: No scleral icterus. Musculoskeletal:        General: Normal range of motion.     Comments: R THIGH- no masses, redness, bruising on area of pain. Has local tenderness on soft tissue on supra patella tendon region and femoral bone with palpation.   Neurological:     Mental Status: She is alert and oriented to person, place, and time.     Gait: Gait abnormal.     Deep Tendon Reflexes: Reflexes normal.  Psychiatric:        Mood and Affect: Mood normal.        Behavior: Behavior normal.        Thought Content: Thought content normal.        Judgment: Judgment normal.    UC Treatments / Results  Labs (all labs ordered are listed, but only abnormal results are displayed) Labs Reviewed - No data to display  EKG   Radiology DG Femur Min 2 Views Right Result Date: 06/19/2023 CLINICAL DATA:  Pain in right leg for a week.  No history of trauma EXAM: RIGHT FEMUR 2 VIEWS COMPARISON:  None Available. FINDINGS: Osteopenia. No fracture or dislocation. Preserved adjacent joint spaces. Prominent vascular calcifications are seen. If there is persistent pain or further concern follow up is recommended to assess for occult abnormality. IMPRESSION: Osteopenia.  No acute osseous abnormality. Electronically Signed   By: Karen Kays M.D.   On: 06/19/2023 14:41    Procedures Procedures (including critical care time)  Medications Ordered in UC Medications - No data to display  Initial Impression / Assessment and Plan / UC Course  I have reviewed the triage vital signs and the nursing notes.  Pertinent imaging results that were available during my care of the patient were reviewed by me and considered in my medical decision making (see chart for details).  R Subpatellar tendonitis  I placed her on Voltaren gel as noted.    Final Clinical Impressions(s) / UC Diagnoses   Final diagnoses:  Arthralgia of right lower leg  Pain of right thigh     Discharge Instructions      I will  call you if the xray shows anything different than what I saw today     ED Prescriptions     Medication  Sig Dispense Auth. Provider   diclofenac Sodium (VOLTAREN) 1 % GEL  (Status: Discontinued) Apply 2 g topically 4 (four) times daily. 100 g Rodriguez-Southworth, Nettie Elm, PA-C   diclofenac Sodium (VOLTAREN) 1 % GEL Apply 2 g topically 4 (four) times daily. 100 g Rodriguez-Southworth, Nettie Elm, PA-C      PDMP not reviewed this encounter.   Garey Ham, [place] 06/19/23 1531

## 2023-06-19 NOTE — ED Triage Notes (Signed)
Pt reports upper right leg pain x 1week.  Radiates from above knee into R groin area. No injury, swelling, or reduced ROM. Pt has no recurrent hx of similar pain and no known cause. Pt remain ambulatory with assistance, which is her baseline. Uses devices or assistance from son for ambulation. Son resides in home with her. Notes pt did have 1 fall this past month, but pt/son state fall did not affect R side. Pt has peripheral pitting edema to bilateral ankles at baseline.

## 2023-06-23 NOTE — Progress Notes (Deleted)
a

## 2023-06-25 NOTE — Progress Notes (Deleted)
Cardiology Clinic Note   Patient Name: Kayla Booth Date of Encounter: 06/25/2023  Primary Care Provider:  Pcp, No Primary Cardiologist:  Donato Schultz, MD  Patient Profile    Kayla Booth 83 year old female presents to the clinic today for follow-up evaluation of her atrial fibrillation and essential hypertension.  Past Medical History    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HCC)    HTN (hypertension)    Hyperlipidemia    Persistent atrial fibrillation (HCC) 12/16/2017   Eliquis DCd in May 2023 due to frequent falls   Past Surgical History:  Procedure Laterality Date   CARDIOVERSION N/A 01/19/2018   Procedure: CARDIOVERSION;  Surgeon: Wendall Stade, MD;  Location: [facility] ENDOSCOPY;  Service: Cardiovascular;  Laterality: N/A;   SPLENECTOMY      Allergies  Allergies  Allergen Reactions   Aspirin Rash    History of Present Illness    Kayla Booth has a PMH of persistent atrial fibrillation (failed DCCV previously and anticoagulation discontinued 5/23 due to frequent falls), coronary artery disease, aortic atherosclerosis, HTN, HLD, frequent falls, hereditary Spherocytosis.  Echocardiogram 12/17/2017 showed an EF of 50-55%, mild AI, trivial mitral valve regurgitation, trivial TR and PASP of 22.  She was seen in follow-up by Reather Littler NP-C on 10/25/2022.  She had been seen in the emergency department 2/24 and 5/24 following mechanical falls.  No head trauma was reported.  X-rays and CT scans did not show serious injury.  She did sustain a left arm laceration from her fall 2/24.  On follow-up 10/25/2022 she denied palpitations, chest pain, shortness of breath, and headaches or syncope.  She did feel that her legs were a little swollen.  She reported this was at her baseline.  She continued to use 2 pillows with sleeping.  She presented  with her son and was walking with a walker.  She was seen in the emergency department 06/19/2023.  She presented with her son.  She complained of anterior distal thigh pain.  Pain had been present x 1 week.  She did not recall an injury or trauma.  She was prescribed diclofenac gel.  Her leg x-ray showed osteopenia and no acute osseous abnormalities.   She presents to the clinic today for follow-up evaluation and states***.   *** denies chest pain, shortness of breath, lower extremity edema, fatigue, palpitations, melena, hematuria, hemoptysis, diaphoresis, weakness, presyncope, syncope, orthopnea, and PND.   Persistent atrial fibrillation-heart rate today***.  Not a candidate for anticoagulation due to frequent falls. Avoid triggers caffeine, chocolate, EtOH, dehydration etc. Continue metoprolol Continue to monitor  Essential hypertension-BP today***. Heart healthy low-sodium diet Increase physical activity as tolerated Mood to blood pressure log   Aortic atherosclerosis-noted on CT 12/18/2017. High-fiber diet Increase physical activity as tolerated Continue omega-3 fatty acids  Disposition: Follow-up with Dr. Anne Fu or me in 6 months.  Home Medications    Prior to Admission medications   Medication Sig Start Date End Date Taking? Authorizing Provider  acetaminophen (TYLENOL 8 HOUR) 650 MG CR tablet Take 1 tablet (650 mg total) by mouth every 8 (eight) hours as needed for pain. 09/04/19   Cathie Hoops, Amy V, PA-C  cholecalciferol (VITAMIN D) 400 units TABS tablet Take 400 Units by mouth daily.    [provider]  diclofenac Sodium (VOLTAREN) 1 % GEL Apply 2 g topically 4 (four)  times daily. 06/19/23   Rodriguez-Southworth, Nettie Elm, PA-C  metoprolol tartrate (LOPRESSOR) 25 MG tablet TAKE 1 TABLET BY MOUTH TWICE A DAY 11/15/22   Jake Bathe, MD  Omega-3 Fatty Acids (OMEGA-3 PO) Take 520 mg by mouth daily.    [provider]  vitamin B-12 (CYANOCOBALAMIN) 1000 MCG tablet Take  1,000 mcg by mouth daily.    [provider]    Family History    Family History  Problem Relation Age of Onset   Heart failure Other    Diabetes Mother    Stroke Mother    She indicated that her mother is deceased. She indicated that her father is deceased. She indicated that her maternal grandmother is deceased. She indicated that her maternal grandfather is deceased. She indicated that her paternal grandmother is deceased. She indicated that her paternal grandfather is deceased. She indicated that the status of her other is unknown.  Social History    Social History   Socioeconomic History   Marital status: Divorced    Spouse name: Not on file   Number of children: Not on file   Years of education: Not on file   Highest education level: Not on file  Occupational History   Not on file  Tobacco Use   Smoking status: Never   Smokeless tobacco: Never  Vaping Use   Vaping status: Never Used  Substance and Sexual Activity   Alcohol use: No   Drug use: No   Sexual activity: Not on file  Other Topics Concern   Not on file  Social History Narrative   Not on file   Social Drivers of Health   Financial Resource Strain: Not on file  Food Insecurity: Not on file  Transportation Needs: Not on file  Physical Activity: Not on file  Stress: Not on file  Social Connections: Unknown (03/16/2023)   Received from [facility]   Social Network    Social Network: Not on file  Intimate Partner Violence: Unknown (03/16/2023)   Received from [facility]   HITS    Physically Hurt: Not on file    Insult or Talk Down To: Not on file    Threaten Physical Harm: Not on file    Scream or Curse: Not on file     Review of Systems    General:  No chills, fever, night sweats or weight changes.  Cardiovascular:  No chest pain, dyspnea on exertion, edema, orthopnea, palpitations, paroxysmal nocturnal dyspnea. Dermatological: No rash, lesions/masses Respiratory: No cough,  dyspnea Urologic: No hematuria, dysuria Abdominal:   No nausea, vomiting, diarrhea, bright red blood per rectum, melena, or hematemesis Neurologic:  No visual changes, wkns, changes in mental status. All other systems reviewed and are otherwise negative except as noted above.  Physical Exam    VS:  There were no vitals taken for this visit. , BMI There is no height or weight on file to calculate BMI. GEN: Well nourished, well developed, in no acute distress. HEENT: normal. Neck: Supple, no JVD, carotid bruits, or masses. Cardiac: RRR, no murmurs, rubs, or gallops. No clubbing, cyanosis, edema.  Radials/DP/PT 2+ and equal bilaterally.  Respiratory:  Respirations regular and unlabored, clear to auscultation bilaterally. GI: Soft, nontender, nondistended, BS + x 4. MS: no deformity or atrophy. Skin: warm and dry, no rash. Neuro:  Strength and sensation are intact. Psych: Normal affect.  Accessory Clinical Findings    Recent Labs: 10/14/2022: BUN 9; Creatinine, Ser 1.04; Hemoglobin 14.4; Platelets 526; Potassium 3.4;  Sodium 139   Recent Lipid Panel    Component Value Date/Time   CHOL 210 (H) 04/01/2021 0942   TRIG 113 04/01/2021 0942   HDL 62 04/01/2021 0942   CHOLHDL 3.4 04/01/2021 0942   LDLCALC 128 (H) 04/01/2021 0942    No BP recorded.  {Refresh Note OR Click here to enter BP  :1}***    ECG personally reviewed by me today- ***    Echocardiogram 12/17/2017  Study Conclusions   - Left ventricle: The cavity size was normal. Systolic function was    normal. The estimated ejection fraction was in the range of 50%    to 55%. Wall motion was normal; there were no regional wall    motion abnormalities.  - Aortic valve: There was mild regurgitation.  - Mitral valve: There was trivial regurgitation.  - Right ventricle: Systolic function was normal.  - Atrial septum: No defect or patent foramen ovale was identified.  - Tricuspid valve: There was trivial regurgitation. Peak  RV-RA    gradient (S): 19 mm Hg.  - Pulmonic valve: There was no significant regurgitation.  - Pulmonary arteries: Systolic pressure was within the normal    range. PA peak pressure: 22 mm Hg (S).   Impressions:   - Atrial fibrillation throughout study. Normal LV function. Mild    AR, trivial MR/TR.      Assessment & Plan   1.  ***   Thomasene Ripple. Mardi Cannady NP-C     06/25/2023, 2:47 PM [facility] [address] Office [phone] Fax [phone]    I spent***minutes examining this patient, reviewing medications, and using patient centered shared decision making involving their cardiac care.   I spent greater than 20 minutes reviewing their past medical history,  medications, and prior cardiac tests.

## 2023-06-26 ENCOUNTER — Ambulatory Visit: Payer: Medicare HMO | Admitting: Student

## 2023-06-26 ENCOUNTER — Ambulatory Visit: Payer: Medicare HMO | Admitting: General Practice

## 2023-07-12 NOTE — Progress Notes (Deleted)
 Cardiology Clinic Note   Patient Name: Kayla Booth Date of Encounter: 07/12/2023  Primary Care Provider:  Pcp, No Primary Cardiologist:  Donato Schultz, MD  Patient Profile    Kayla Booth 83 year old female presents to the clinic today for follow-up evaluation of her atrial fibrillation and essential hypertension.  Past Medical History    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HCC)    HTN (hypertension)    Hyperlipidemia    Persistent atrial fibrillation (HCC) 12/16/2017   Eliquis DCd in May 2023 due to frequent falls   Past Surgical History:  Procedure Laterality Date   CARDIOVERSION N/A 01/19/2018   Procedure: CARDIOVERSION;  Surgeon: Wendall Stade, MD;  Location: [facility] ENDOSCOPY;  Service: Cardiovascular;  Laterality: N/A;   SPLENECTOMY      Allergies  Allergies  Allergen Reactions   Aspirin Rash    History of Present Illness    Kayla Booth has a PMH of persistent atrial fibrillation (failed DCCV previously and anticoagulation discontinued 5/23 due to frequent falls), coronary artery disease, aortic atherosclerosis, HTN, HLD, frequent falls, hereditary Spherocytosis.  Echocardiogram 12/17/2017 showed an EF of 50-55%, mild AI, trivial mitral valve regurgitation, trivial TR and PASP of 22.  She was seen in follow-up by Reather Littler NP-C on 10/25/2022.  She had been seen in the emergency department 2/24 and 5/24 following mechanical falls.  No head trauma was reported.  X-rays and CT scans did not show serious injury.  She did sustain a left arm laceration from her fall 2/24.  On follow-up 10/25/2022 she denied palpitations, chest pain, shortness of breath, and headaches or syncope.  She did feel that her legs were a little swollen.  She reported this was at her baseline.  She continued to use 2 pillows with sleeping.  She presented  with her son and was walking with a walker.  She was seen in the emergency department 06/19/2023.  She presented with her son.  She complained of anterior distal thigh pain.  Pain had been present x 1 week.  She did not recall an injury or trauma.  She was prescribed diclofenac gel.  Her leg x-ray showed osteopenia and no acute osseous abnormalities.   She presents to the clinic today for follow-up evaluation and states***.   *** denies chest pain, shortness of breath, lower extremity edema, fatigue, palpitations, melena, hematuria, hemoptysis, diaphoresis, weakness, presyncope, syncope, orthopnea, and PND.   Persistent atrial fibrillation-heart rate today***.  Not a candidate for anticoagulation due to frequent falls. Avoid triggers caffeine, chocolate, EtOH, dehydration etc. Continue metoprolol Continue to monitor  Essential hypertension-BP today***. Heart healthy low-sodium diet Increase physical activity as tolerated Mood to blood pressure log   Aortic atherosclerosis-noted on CT 12/18/2017. High-fiber diet Increase physical activity as tolerated Continue omega-3 fatty acids  Disposition: Follow-up with Dr. Anne Fu or me in 6 months.  Home Medications    Prior to Admission medications   Medication Sig Start Date End Date Taking? Authorizing Provider  acetaminophen (TYLENOL 8 HOUR) 650 MG CR tablet Take 1 tablet (650 mg total) by mouth every 8 (eight) hours as needed for pain. 09/04/19   Cathie Hoops, Amy V, PA-C  cholecalciferol (VITAMIN D) 400 units TABS tablet Take 400 Units by mouth daily.    [provider]  diclofenac Sodium (VOLTAREN) 1 % GEL Apply 2 g topically 4 (four)  times daily. 06/19/23   Rodriguez-Southworth, Nettie Elm, PA-C  metoprolol tartrate (LOPRESSOR) 25 MG tablet TAKE 1 TABLET BY MOUTH TWICE A DAY 11/15/22   Jake Bathe, MD  Omega-3 Fatty Acids (OMEGA-3 PO) Take 520 mg by mouth daily.    [provider]  vitamin B-12 (CYANOCOBALAMIN) 1000 MCG tablet Take  1,000 mcg by mouth daily.    [provider]    Family History    Family History  Problem Relation Age of Onset   Heart failure Other    Diabetes Mother    Stroke Mother    She indicated that her mother is deceased. She indicated that her father is deceased. She indicated that her maternal grandmother is deceased. She indicated that her maternal grandfather is deceased. She indicated that her paternal grandmother is deceased. She indicated that her paternal grandfather is deceased. She indicated that the status of her other is unknown.  Social History    Social History   Socioeconomic History   Marital status: Divorced    Spouse name: Not on file   Number of children: Not on file   Years of education: Not on file   Highest education level: Not on file  Occupational History   Not on file  Tobacco Use   Smoking status: Never   Smokeless tobacco: Never  Vaping Use   Vaping status: Never Used  Substance and Sexual Activity   Alcohol use: No   Drug use: No   Sexual activity: Not on file  Other Topics Concern   Not on file  Social History Narrative   Not on file   Social Drivers of Health   Financial Resource Strain: Not on file  Food Insecurity: Not on file  Transportation Needs: Not on file  Physical Activity: Not on file  Stress: Not on file  Social Connections: Unknown (03/16/2023)   Received [facility]   Social Network    Social Network: Not on file  Intimate Partner Violence: Unknown (03/16/2023)   Received from [facility]   HITS    Physically Hurt: Not on file    Insult or Talk Down To: Not on file    Threaten Physical Harm: Not on file    Scream or Curse: Not on file     Review of Systems    General:  No chills, fever, night sweats or weight changes.  Cardiovascular:  No chest pain, dyspnea on exertion, edema, orthopnea, palpitations, paroxysmal nocturnal dyspnea. Dermatological: No rash, lesions/masses Respiratory: No cough,  dyspnea Urologic: No hematuria, dysuria Abdominal:   No nausea, vomiting, diarrhea, bright red blood per rectum, melena, or hematemesis Neurologic:  No visual changes, wkns, changes in mental status. All other systems reviewed and are otherwise negative except as noted above.  Physical Exam    VS:  There were no vitals taken for this visit. , BMI There is no height or weight on file to calculate BMI. GEN: Well nourished, well developed, in no acute distress. HEENT: normal. Neck: Supple, no JVD, carotid bruits, or masses. Cardiac: RRR, no murmurs, rubs, or gallops. No clubbing, cyanosis, edema.  Radials/DP/PT 2+ and equal bilaterally.  Respiratory:  Respirations regular and unlabored, clear to auscultation bilaterally. GI: Soft, nontender, nondistended, BS + x 4. MS: no deformity or atrophy. Skin: warm and dry, no rash. Neuro:  Strength and sensation are intact. Psych: Normal affect.  Accessory Clinical Findings    Recent Labs: 10/14/2022: BUN 9; Creatinine, Ser 1.04; Hemoglobin 14.4; Platelets 526; Potassium 3.4;  Sodium 139   Recent Lipid Panel    Component Value Date/Time   CHOL 210 (H) 04/01/2021 0942   TRIG 113 04/01/2021 0942   HDL 62 04/01/2021 0942   CHOLHDL 3.4 04/01/2021 0942   LDLCALC 128 (H) 04/01/2021 0942    No BP recorded.  {Refresh Note OR Click here to enter BP  :1}***    ECG personally reviewed by me today- ***    Echocardiogram 12/17/2017  Study Conclusions   - Left ventricle: The cavity size was normal. Systolic function was    normal. The estimated ejection fraction was in the range of 50%    to 55%. Wall motion was normal; there were no regional wall    motion abnormalities.  - Aortic valve: There was mild regurgitation.  - Mitral valve: There was trivial regurgitation.  - Right ventricle: Systolic function was normal.  - Atrial septum: No defect or patent foramen ovale was identified.  - Tricuspid valve: There was trivial regurgitation. Peak  RV-RA    gradient (S): 19 mm Hg.  - Pulmonic valve: There was no significant regurgitation.  - Pulmonary arteries: Systolic pressure was within the normal    range. PA peak pressure: 22 mm Hg (S).   Impressions:   - Atrial fibrillation throughout study. Normal LV function. Mild    AR, trivial MR/TR.      Assessment & Plan   1.  ***   Thomasene Ripple. Elliet Goodnow NP-C     07/12/2023, 7:34 AM [facility] [address] Office [phone] Fax [phone]    I spent***minutes examining this patient, reviewing medications, and using patient centered shared decision making involving their cardiac care.   I spent greater than 20 minutes reviewing their past medical history,  medications, and prior cardiac tests.

## 2023-07-13 ENCOUNTER — Ambulatory Visit: Payer: Medicare HMO | Admitting: General Practice

## 2023-10-25 ENCOUNTER — Ambulatory Visit: Payer: Medicare HMO | Attending: Cardiology | Admitting: Cardiology

## 2023-10-25 ENCOUNTER — Encounter: Payer: Self-pay | Admitting: Cardiology

## 2023-10-25 VITALS — BP 130/75 | HR 83 | Ht 60.0 in

## 2023-10-25 DIAGNOSIS — I700 Atherosclerosis of aorta: Secondary | ICD-10-CM

## 2023-10-25 DIAGNOSIS — I10 Essential (primary) hypertension: Secondary | ICD-10-CM

## 2023-10-25 DIAGNOSIS — R296 Repeated falls: Secondary | ICD-10-CM | POA: Diagnosis not present

## 2023-10-25 DIAGNOSIS — I4811 Longstanding persistent atrial fibrillation: Secondary | ICD-10-CM

## 2023-10-25 NOTE — Progress Notes (Signed)
 Cardiology Office Note:  .   Date:  10/25/2023  ID:  Guadalupe, Kerekes 1941-04-16, MRN [id number] PCP: Juvenal Opoka  [facility] Providers Cardiologist:  Dorothye Gathers, MD     History of Present Illness: .   Kayla Booth is a 83 y.o. female Discussed the use of AI scribe software for clinical note transcription with the patient, who gave verbal consent to proceed.  History of Present Illness Kayla Booth is an 83 year old female with long-standing persistent atrial fibrillation who presents for follow-up.  She continues to manage her atrial fibrillation with metoprolol  tartrate 25 mg daily for rate control. She is not on anticoagulation therapy due to a history of mechanical falls in 2024, which led to the discontinuation of blood thinners due to bleeding risk. She experiences occasional mild chest discomfort but denies chest pain and significant cough.  She has a history of aortic atherosclerosis and is allergic to aspirin. She takes fish oil supplements, which she finds difficult to swallow due to their large size, leading to occasional missed doses. She also takes vitamin B and D supplements.  She reports that COVID-19 significantly impacted her health, affecting her teeth, hair, and overall well-being. She had perfect teeth prior to the illness but has since experienced deterioration.  She does not have a regular primary care doctor and typically seeks medical care at urgent care or the hospital.      Studies Reviewed: Aaron Aas    EKG Interpretation Date/Time:  Wednesday Oct 25 2023 13:25:20 EDT Ventricular Rate:  83 PR Interval:    QRS Duration:  62 QT Interval:  370 QTC Calculation: 434 R Axis:   85  Text Interpretation: Atrial fibrillation Low voltage QRS When compared with ECG of 11-Jul-2022 21:26, No significant change was found Confirmed by Dorothye Gathers ([id number]) on 10/25/2023 1:45:34 PM    Results RADIOLOGY PET CT: Normal (2024)  DIAGNOSTIC EKG: Atrial  fibrillation, good rate control (10/25/2023)  Risk Assessment/Calculations:            Physical Exam:   VS:  BP 130/75   Pulse 83   Ht 5' (1.524 m)   SpO2 97%   BMI 23.44 kg/m    Wt Readings from Last 3 Encounters:  04/02/23 120 lb (54.4 kg)  10/25/22 129 lb (58.5 kg)  10/14/22 125 lb (56.7 kg)    GEN: Well nourished, well developed in no acute distress NECK: No JVD; No carotid bruits, Kyphosis CARDIAC: RRR, no murmurs, no rubs, no gallops RESPIRATORY:  Clear to auscultation without rales, wheezing or rhonchi  ABDOMEN: Soft, non-tender, non-distended EXTREMITIES:  No edema; No deformity   ASSESSMENT AND PLAN: .    Assessment and Plan Assessment & Plan Longstanding persistent atrial fibrillation Longstanding persistent atrial fibrillation with effective rate control on metoprolol  tartrate. Anticoagulation was discontinued in 2023 due to mechanical falls and risk of head injury. Current management focuses on rate control without anticoagulation due to fall risk. No recent episodes of chest pain or significant symptoms reported. - Continue metoprolol  tartrate 25 mg orally twice daily for rate control. - Avoid anticoagulation due to fall risk.  Primary hypertension Primary hypertension is well-controlled with metoprolol  tartrate. - Continue metoprolol  tartrate 25 mg orally twice daily for blood pressure control.  Aortic atherosclerosis Aortic atherosclerosis is present without specific symptoms or acute issues.  General Health Maintenance She currently does not have a primary care provider and relies on urgent care and hospital visits for acute issues. -  Encourage establishing care with a primary care provider for comprehensive health management.  Follow-up Follow-up plan to ensure continued monitoring and management of atrial fibrillation and other cardiovascular conditions. - Schedule follow-up appointment in one year.         Dispo: 1 yr  Signed, Dorothye Gathers, MD

## 2023-10-25 NOTE — Patient Instructions (Signed)

## 2023-11-10 ENCOUNTER — Other Ambulatory Visit: Payer: Self-pay | Admitting: Cardiology
